# Patient Record
Sex: Female | Born: 1960 | Race: White | Hispanic: No | Marital: Married | Smoking: Never smoker
Health system: Southern US, Academic
[De-identification: ages and names within clinical notes are randomized; demographics above are authoritative.]

## PROBLEM LIST (undated history)

## (undated) DIAGNOSIS — E789 Disorder of lipoprotein metabolism, unspecified: Secondary | ICD-10-CM

## (undated) DIAGNOSIS — E7800 Pure hypercholesterolemia, unspecified: Secondary | ICD-10-CM

## (undated) DIAGNOSIS — L719 Rosacea, unspecified: Secondary | ICD-10-CM

## (undated) DIAGNOSIS — E785 Hyperlipidemia, unspecified: Secondary | ICD-10-CM

## (undated) HISTORY — PX: HX MENISCECTOMY: SHX123

## (undated) HISTORY — PX: HX OOPHORECTOMY: SHX86

## (undated) HISTORY — PX: HX ENDOMETRIAL ABLATION: 2100001129

## (undated) HISTORY — DX: Rosacea, unspecified: L71.9

## (undated) HISTORY — PX: HX ADENOIDECTOMY: SHX29

## (undated) HISTORY — PX: HX WISDOM TEETH EXTRACTION: SHX21

## (undated) HISTORY — PX: HX TONSIL AND ADENOIDECTOMY: SHX28

## (undated) HISTORY — DX: Pure hypercholesterolemia, unspecified: E78.00

## (undated) NOTE — Progress Notes (Signed)
 Formatting of this note is different from the original.  Subjective   Patient ID: Erica Harper is a 28 y.o. female presenting to the Urgent Care with a chief complaint of Hand Injury (Was running tripped over rug and landed on left hand /Hand painful to move and swollen happened yesterday ).    History provided by:  Patient  Hand Injury  Location:  Wrist  Wrist location:  L wrist  Pain details:     Quality:  Dull    Radiates to:  Does not radiate    Severity:  Mild    Duration:  1 day    Timing:  Constant    Progression:  Unchanged  Dislocation: no    Foreign body present:  No foreign bodies  Relieved by:  Rest  Worsened by:  Movement    Objective   BP 138/82 (BP Location: Right arm, Patient Position: Sitting, BP Cuff Size: Large adult)   Pulse 79   Temp 36.6 C (97.9 F) (Oral)   Resp 16   Ht 1.6 m (5' 3)   Wt 95.3 kg (210 lb)   SpO2 98%   BMI 37.20 kg/m     Physical Exam  Constitutional:       Appearance: Normal appearance.   HENT:      Head: Normocephalic and atraumatic.   Eyes:      Extraocular Movements: Extraocular movements intact.      Pupils: Pupils are equal, round, and reactive to light.   Cardiovascular:      Rate and Rhythm: Normal rate.   Pulmonary:      Effort: Pulmonary effort is normal.   Musculoskeletal:         General: Swelling and tenderness present. Normal range of motion.      Cervical back: Normal range of motion.      Comments: Mild tenderness and swelling of the dorsal and radial aspect of the left wrist. Full range of motion but has some pain with flexion.    Skin:     General: Skin is warm and dry.      Findings: No bruising or rash.   Neurological:      General: No focal deficit present.      Mental Status: She is alert.   Psychiatric:         Mood and Affect: Mood normal.         Assessment & Plan    Assessment & Plan  Sprain of left wrist, initial encounter    Orders:    XR wrist 3+ views left        In-House Lab Results:   No results found for this or any previous visit.      In-House Imaging Reads:  X-Ray Wet Read Documentation  XR wrist 3+ views left        3 views of the wrist. On my review of images, there are no acute bony abnormalities. Small amount of soft issue swelling along radial aspect of wrist. Radiologist read pending.             Procedure Documentation:  Procedures     ED Course & MDM     Electronically signed by Clem KANDICE Cobia, DO at 08/25/2023  5:41 PM EDT

---

## 2006-04-21 ENCOUNTER — Ambulatory Visit (HOSPITAL_COMMUNITY): Payer: Self-pay | Admitting: OBSTETRICS/GYNECOLOGY

## 2007-04-30 ENCOUNTER — Other Ambulatory Visit: Payer: Self-pay

## 2009-06-11 ENCOUNTER — Encounter (FREE_STANDING_LABORATORY_FACILITY)
Admit: 2009-06-11 | Discharge: 2009-06-11 | Disposition: A | Discharge status: Home / Self Care | Payer: BC Managed Care – PPO | Attending: Obstetrics & Gynecology | Admitting: Obstetrics & Gynecology

## 2009-06-11 ENCOUNTER — Ambulatory Visit (INDEPENDENT_AMBULATORY_CARE_PROVIDER_SITE_OTHER): Payer: BC Managed Care – PPO | Admitting: Obstetrics & Gynecology

## 2009-06-11 ENCOUNTER — Encounter (INDEPENDENT_AMBULATORY_CARE_PROVIDER_SITE_OTHER): Payer: Self-pay | Admitting: Obstetrics & Gynecology

## 2009-06-11 DIAGNOSIS — Z01419 Encounter for gynecological examination (general) (routine) without abnormal findings: Secondary | ICD-10-CM | POA: Insufficient documentation

## 2009-06-11 NOTE — Progress Notes (Signed)
Subjective:     Patient ID:  Erica Harper is an 49 y.o. female   Chief Complaint:  Chief Complaint   Patient presents with   . Gyn Exam         HPI 49 yo G2P2 spouse s/p vasectomy for annual exam. Monthly menses, + cramps, changes protection q 90-120 mins for 1st day. Neg pain.  All paps nl.  Chol ?  Dt 10 yrs ago    Review of Systems   Gastrointestinal:        Itchy hemorrhoids   Genitourinary:        Occ sui with sneeze or URI   All other systems reviewed and are negative.      Objective:   Physical Exam   Constitutional: She is oriented. She appears well-developed and well-nourished. No distress.   HENT:   Head: Normocephalic.   Eyes: Right eye exhibits no discharge. Left eye exhibits no discharge. No scleral icterus.   Neck: Normal range of motion.   Pulm:  Effort normal.  Observations:  no respiratory distress.    Abdomen:   She exhibits no distension. Soft. No tenderness. She has no rebound and no guarding.   GU:    External Exam:    External exam normal.    No neither urethral exudate nor urethral tenderness.  No genital lesions present.      Vaginal Exam:    Vagina normal to exam.  No cystocele present.  No rectocele present.  No vaginal prolapse.  No vaginal discharge present.  No vaginal odor present.  No amine odor present.  Cervix is normal to exam.      Bladder:    Bladder is normal to exam.      Uterus:   Uterus is normal to exam.     Position:  Anteverted.    Contour:  Regular.    Mobility:  Mobile.      There is no no uterine prolapse noted.      Adenexal findings:    Mobile, no masses and no tenderness.        Rectal Exam:   Rectum normal to exam.     Ortho/Musculoskeletal:   Normal range of motion.   Neurological: She is alert and oriented.   Skin: Skin is warm and dry. No rash noted. She is not diaphoretic. No erythema. No pallor.   Psychiatric: She has a normal mood and affect. Her behavior is normal. Judgment and thought content normal.      .Breasts:  Symmetrical, no nipple retraction, skin dimpling, no dominant masses, nipple discharge or lymphadenopathy bilaterally     Assessment & Plan:     49 yo G2P2 spouse s/p vasectomy with menorrhagia.  Sister is amenorrheic after ablation.  Will consider it.  Will get pelvic u/s.  Will need endodmetrial biopsy if she opts for ablation.  Annual mammogram.  Calcium with D 500-600 mg bid. Monthly SBE.

## 2009-06-13 ENCOUNTER — Ambulatory Visit (INDEPENDENT_AMBULATORY_CARE_PROVIDER_SITE_OTHER): Payer: BC Managed Care – PPO

## 2009-06-13 LAB — HISTORICAL CYTOPATHOLOGY-GYN (PAP AND HPV TESTS)

## 2009-06-13 NOTE — Progress Notes (Signed)
See [place] report  Zandrea Kenealy, RDMS 06/13/2009, 9:53 AM

## 2009-06-15 ENCOUNTER — Ambulatory Visit (INDEPENDENT_AMBULATORY_CARE_PROVIDER_SITE_OTHER): Payer: Self-pay | Admitting: Obstetrics & Gynecology

## 2009-06-15 NOTE — Telephone Encounter (Signed)
VO Dr Kathyrn Sheriff  Pt needs to make appt to come in and discuss.  Hilaria Ota, LPN 0/98/1191, 47:82 AM

## 2009-06-15 NOTE — Telephone Encounter (Signed)
Message copied by Ricci Barker, S on Fri Jun 15, 2009 11:45 AM  ------       Message from: [place]       Created: Fri Jun 15, 2009 10:57 AM         >> Erica Harper 06/15/2009 10:57 AM       This is a pt of Dr Kathyrn Sheriff- pt was seen on 4-27 for an U/S and a fibroid was found.  She wanted to know what the next step in treatment is going to be.  She asked that someone call her to discuss this.  Her home # is [phone] and cell is [phone].  Thanks

## 2009-06-18 ENCOUNTER — Ambulatory Visit (INDEPENDENT_AMBULATORY_CARE_PROVIDER_SITE_OTHER): Payer: BC Managed Care – PPO | Admitting: Obstetrics & Gynecology

## 2009-06-18 NOTE — H&P (Addendum)
I have reviewed the images.   Lg post fibroid, poss pedunculated about 6 cm,and several smaller intramural fibroids.   endom 1.46 cm. Ovaries not well visualized.   Will evaluate ovaries via MRI pelvis.

## 2009-06-18 NOTE — Progress Notes (Signed)
Her to f/u u/s results.  Lg post fibroid, poss pedunculated about 6 cm,and several smaller intramural fibroids.  endom 1.46 cm.  Ovaries not well visualized.  Will evaluate ovaries via MRI pelvis.  Pt considering [place] or Novasure.   Plan:  F/u after MRI.  Will do embx at that visit.  Reviewed u/s images at computer with pt.  15/15 mins reviewing images, disc results and options.

## 2009-06-21 ENCOUNTER — Ambulatory Visit (INDEPENDENT_AMBULATORY_CARE_PROVIDER_SITE_OTHER): Payer: Self-pay | Admitting: Obstetrics & Gynecology

## 2009-06-21 NOTE — Telephone Encounter (Signed)
Gave message to anita to schd.appt.  Hilaria Ota, LPN 02/22/1094, 0:45 PM

## 2009-06-21 NOTE — Telephone Encounter (Signed)
Message copied by Hilaria Ota on Thu Jun 21, 2009  2:05 PM  ------       Message from: [place], [place]       Created: Thu Jun 21, 2009 12:21 PM         >> [name] 06/21/2009 12:21 PM       [place] pt              Pt is calling Dr Kathyrn Sheriff told her to schedule a follow up the day after her mri. She is scheduled for 06/26/2009 for the MRI.  I don't have any appts with [facility] until 07/17/2009. Please advise and call pt.              Thank you.

## 2009-06-26 ENCOUNTER — Ambulatory Visit (INDEPENDENT_AMBULATORY_CARE_PROVIDER_SITE_OTHER): Payer: Self-pay | Admitting: Obstetrics & Gynecology

## 2009-06-26 NOTE — Telephone Encounter (Signed)
Message copied by Hilaria Ota on Tue Jun 26, 2009 12:39 PM  ------       Message from: [place]       Created: Tue Jun 26, 2009 12:27 PM         >> [name] 06/26/2009 12:27 PM       Erica Harper              Harper is calling because she is scheduled for an MRI today and started her period. He wanted to make sure it was ok to still get the MRI.               She is also scheduled for a biopsy tomorrow with Dr Kathyrn Sheriff and wanted to make sure it was ok to be on her period with the biopsy.              Please return her call.              Thank you.

## 2009-06-26 NOTE — Telephone Encounter (Signed)
Pt called started period wanted to know if she could have MRI done explained yes but needs to cancel biopsy tomoorrow related to period per Dr Kathyrn Sheriff.  Hilaria Ota, LPN 1/91/4782, 95:62 PM

## 2009-06-27 ENCOUNTER — Ambulatory Visit (INDEPENDENT_AMBULATORY_CARE_PROVIDER_SITE_OTHER): Payer: BC Managed Care – PPO | Admitting: Obstetrics & Gynecology

## 2009-06-28 ENCOUNTER — Ambulatory Visit (INDEPENDENT_AMBULATORY_CARE_PROVIDER_SITE_OTHER): Payer: Self-pay | Admitting: Obstetrics & Gynecology

## 2009-06-28 NOTE — Telephone Encounter (Signed)
Pt called wanting results of MRI.  Informed her that MRI showed the fibroids (which she was aware of) and tiny ovarian cyst

## 2009-06-28 NOTE — Telephone Encounter (Signed)
Patient not at home left a message that Dr. Isidore Moos was returning her phone call.

## 2009-06-28 NOTE — Telephone Encounter (Signed)
Message copied by Valerie Salts on Thu Jun 28, 2009  8:50 AM  ------       Message from: [employer]       Created: Wed Jun 27, 2009  4:06 PM         >> Mikey [employer] 06/27/2009 04:06 PM       [facility] patient              Patient is calling for test results.       Please call [phone]

## 2009-07-10 ENCOUNTER — Ambulatory Visit (INDEPENDENT_AMBULATORY_CARE_PROVIDER_SITE_OTHER): Payer: BC Managed Care – PPO | Admitting: Obstetrics & Gynecology

## 2009-07-10 ENCOUNTER — Encounter (FREE_STANDING_LABORATORY_FACILITY)
Admit: 2009-07-10 | Discharge: 2009-07-10 | Disposition: A | Discharge status: Home / Self Care | Payer: BLUE CROSS/BLUE SHIELD | Attending: Obstetrics & Gynecology | Admitting: Obstetrics & Gynecology

## 2009-07-10 VITALS — BP 140/90 | Ht 65.0 in | Wt 218.0 lb

## 2009-07-10 DIAGNOSIS — N920 Excessive and frequent menstruation with regular cycle: Secondary | ICD-10-CM | POA: Diagnosis present

## 2009-07-10 NOTE — Procedures (Signed)
Consent obtained.  Cvx stabilized and prepped with Betadine.Pipelle inserted without difficulty.  Uterus sounds to 9 CM. Adequate tissue sample obtained.  Pt tolerated well

## 2009-07-10 NOTE — Progress Notes (Signed)
49 yo G2P2 spouse s/p vasectomy, h/o menorrhagia and fibroids here for [facility].    MRI results: Nabothian cysts are noted. The uterus appears prominent situated in the midline. There is what appears to be a subendometrial fibroid in the uterine fundus posteriorly measuring 2.0 to 2.9 cm. A second larger fibroid is noted in the posterior uterine fundus measuring on the order of 2.6 to 4.8 cm and a third lobulated fibroid measuring on the order of 3.2 to 4.2 cm more cephalad in the uterine fundus. No adnexal masses are seen. The adnexal regions show the ovaries to have tiny cysts. No masses are noted no free fluid is seen.   O: BP 140/90   Ht 1.651 m (5\' 5" )   Wt 98.884 kg (218 lb)   BMI 36.28 kg/m2   LMP 05/25/2009  embx done  A/P:  Options of Novasure, Mirena, and hysterectomy discussed.  Since her bleeding is heavy only 1 day per month, she prefers a less invasive approach.  We will check benefits for Mirena.  She will call next week for bx results and to let me know her final decision.   10 mins spent discussing tx options for menorrhagia and MRI results

## 2009-07-12 LAB — HISTORICAL SURGICAL PATHOLOGY SPECIMEN

## 2009-07-19 ENCOUNTER — Ambulatory Visit (INDEPENDENT_AMBULATORY_CARE_PROVIDER_SITE_OTHER): Payer: Self-pay

## 2009-07-19 NOTE — Telephone Encounter (Signed)
Pt notified that pap and biopsy are normal left message on house phone and cell phone.  Hilaria Ota, LPN 06/22/2128, 8:65 PM

## 2009-07-19 NOTE — Telephone Encounter (Signed)
Message copied by Hilaria Ota on Thu Jul 19, 2009  1:54 PM  ------       Message from: Barkley Bruns       Created: Wed Jul 18, 2009  2:55 PM         >> [facility]ICK 07/18/2009 02:55 PM       [place]. Patient was calling for results of biopsy done 5/24.

## 2009-07-23 ENCOUNTER — Ambulatory Visit (INDEPENDENT_AMBULATORY_CARE_PROVIDER_SITE_OTHER): Payer: Self-pay | Admitting: Obstetrics & Gynecology

## 2009-07-23 NOTE — Telephone Encounter (Signed)
Message copied by Valerie Salts on Mon Jul 23, 2009  9:53 AM  ------       Message from: Georgina Peer       Created: Thu Jul 19, 2009  3:56 PM         >> Georgina Peer 07/19/2009 03:56 PM       Dr. Kathyrn Sheriff patient.  Would like to find out what the insurance would pay on an Ablasion and how soon she could get it done.  Please call to advise.  Thank you

## 2009-07-23 NOTE — Telephone Encounter (Signed)
Attempted to call patient back, left a message that Dr. Kathyrn Sheriff office was returning her call.

## 2009-07-26 NOTE — Telephone Encounter (Signed)
Message copied by Valerie Salts on Thu Jul 26, 2009  9:20 AM  ------       Message from: Valerie Salts       Created: Mon Jul 23, 2009  9:54 AM       Regarding: waiting for return call         >> Georgina Peer 07/19/2009 03:56 PM       Dr. Kathyrn Sheriff patient.  Would like to find out what the insurance would pay on an Ablasion and how soon she could get it done.  Please call to advise.  Thank you

## 2009-07-26 NOTE — Telephone Encounter (Signed)
Patient did not return call.

## 2009-08-01 ENCOUNTER — Ambulatory Visit (INDEPENDENT_AMBULATORY_CARE_PROVIDER_SITE_OTHER): Payer: Self-pay | Admitting: Obstetrics & Gynecology

## 2009-08-01 ENCOUNTER — Other Ambulatory Visit (INDEPENDENT_AMBULATORY_CARE_PROVIDER_SITE_OTHER): Payer: BC Managed Care – PPO

## 2009-08-01 ENCOUNTER — Ambulatory Visit (INDEPENDENT_AMBULATORY_CARE_PROVIDER_SITE_OTHER): Payer: BC Managed Care – PPO | Admitting: Obstetrics & Gynecology

## 2009-08-01 NOTE — Telephone Encounter (Signed)
Message copied by Hilaria Ota on Wed Aug 01, 2009 11:15 AM  ------       Message from: Holly Bodily       Created: Wed Aug 01, 2009  9:37 AM         >> Holly Bodily 08/01/2009 09:37 AM       Dr. Kathyrn Sheriff pt         The patient asked if Dr. Kathyrn Sheriff would call her concerning some questions she has concerning a procedure.  Please advise the pt at [phone].   Thank you

## 2009-08-10 ENCOUNTER — Other Ambulatory Visit (INDEPENDENT_AMBULATORY_CARE_PROVIDER_SITE_OTHER): Payer: BC Managed Care – PPO

## 2009-08-10 ENCOUNTER — Ambulatory Visit (INDEPENDENT_AMBULATORY_CARE_PROVIDER_SITE_OTHER): Payer: BC Managed Care – PPO | Admitting: Obstetrics & Gynecology

## 2009-08-10 ENCOUNTER — Ambulatory Visit (INDEPENDENT_AMBULATORY_CARE_PROVIDER_SITE_OTHER): Payer: BC Managed Care – PPO

## 2009-08-24 NOTE — Procedures (Signed)
Under u/s guidance, sterile saline was infused into the uterine cavity under sterile conditions using a standard HSG catheter.  The uterine cavity was noted to be free of deformities from the fibroid.  Catheter was removed.  Pt tolerated well.

## 2009-08-28 ENCOUNTER — Ambulatory Visit (INDEPENDENT_AMBULATORY_CARE_PROVIDER_SITE_OTHER): Payer: BC Managed Care – PPO | Admitting: Obstetrics & Gynecology

## 2009-08-28 VITALS — BP 110/78 | Wt 203.0 lb

## 2009-08-28 NOTE — Progress Notes (Signed)
49 yo G2P2 spouse s/p vasectomy with h/o menorrhagia, fibroids, severe dysmenorrhea for D&C, H/S, Novasure.  embx proliferative. MRI of pelvis :There is what appears to be a subendometrial fibroid in the uterine fundus posteriorly measuring 2.0 to 2.9 cm. A second larger fibroid is noted in the posterior uterine fundus measuring on the order of 2.6 to 4.8 cm and a third lobulated fibroid measuring on the order of 3.2 to 4.2 cm more cephalad in the uterine fundus. sonohyst revealed a nl endometrial cavity.  No past medical history on file.  Past Surgical History   Procedure Date   . Hx cesarean section      x2   . Hx adenoidectomy        Family History   Problem Relation Age of Onset   . Diabetes Maternal Grandfather    . Breast Cancer Maternal Grandmother 96   . Colon Cancer Neg Hx    . Clotting Disorder Neg Hx    . Heart Disease Neg Hx    . Hypertension Neg Hx    . Osteoporosis Neg Hx    . Rectal Cancer Neg Hx    . Uterine Cancer Neg Hx    . Uterine Fibroids Neg Hx        No current outpatient prescriptions on file.       Allergies   Allergen Reactions   . Penicillins Hives/ Urticaria       History   Social History   . Marital Status: Married     Spouse Name: N/A     Number of Children: N/A   . Years of Education: N/A   Occupational History   .  [employer]     kindergarten aide [place]   .       sp:  [employer]   Social History Main Topics   . Smoking status: Never Smoker    . Smokeless tobacco: Not on file   . Alcohol Use: No   . Drug Use: Not on file   . Sexually Active: Yes -- Female partner(s)     Birth Control/ Protection: Vasectomy   Other Topics Concern   . Abuse/domestic Violence No   . Breast Self Exam Yes   . Calcium Intake Adequate Yes   . Seat Belt Yes   Social History Narrative   . No narrative on file       ROS:  Denies chest pain, dyspnea.  Reports nl bowel and bladder fx.  BP 110/78   Wt 92.08 kg (203 lb)  HEENT unremarkable  Lungs CTAB  Heart RRR w/o RMG   abd soft, NT, no organomegaly, NABS  extr NT, w/o edema  EG: w/o lesions  cvx parous w/o lesions  Corpus upper limits of nl size w/o palpable adnexal masses   A:  49 yo G2P2 with symptomatic fibroids.  P:  D&C, dx H/S, endometrial ablation.  Risks, benefits, and alternatives to surgery discussed with patient and she agrees to proceed.

## 2009-08-29 ENCOUNTER — Other Ambulatory Visit (INDEPENDENT_AMBULATORY_CARE_PROVIDER_SITE_OTHER): Payer: BC Managed Care – PPO

## 2009-08-29 ENCOUNTER — Ambulatory Visit (INDEPENDENT_AMBULATORY_CARE_PROVIDER_SITE_OTHER): Payer: BC Managed Care – PPO | Admitting: Obstetrics & Gynecology

## 2009-08-30 ENCOUNTER — Other Ambulatory Visit: Payer: Self-pay | Admitting: Obstetrics & Gynecology

## 2009-08-31 ENCOUNTER — Ambulatory Visit (INDEPENDENT_AMBULATORY_CARE_PROVIDER_SITE_OTHER): Payer: Self-pay | Admitting: Obstetrics & Gynecology

## 2009-08-31 NOTE — Telephone Encounter (Signed)
Message copied by Ricci Barker, S on Fri Aug 31, 2009 11:29 AM  ------       Message from: [place], [place]       Created: Fri Aug 31, 2009  9:41 AM         >> [name] 08/31/2009 09:41 AM       [facility] pt              Pt had surgery yesterday with [place] and she was told to call and schedule a follow up appt.  She didn't know when she needed to schedule it.  Please advise.               She is also having a problem.  She said when she uses the bathroom it burns when she pees.  She isn't sure if it is from when she had a catheter or not.  Please call.                             Thank you.

## 2009-08-31 NOTE — Telephone Encounter (Signed)
Pt was having burning after urination had ablation yesterday states much better today no burning she thinks it is where they cathed her,instructed pt to drink plenty of water and cranberry juice, call if any problems.  Marland Kitchens

## 2009-09-14 ENCOUNTER — Ambulatory Visit (INDEPENDENT_AMBULATORY_CARE_PROVIDER_SITE_OTHER): Payer: BC Managed Care – PPO | Admitting: Obstetrics & Gynecology

## 2009-09-14 VITALS — BP 125/83 | HR 79 | Ht 65.0 in | Wt 200.0 lb

## 2009-09-14 NOTE — Progress Notes (Signed)
49 yo G2P2 s/p Novasure, here for post op ck.  Doing well postoperatively, no c/o.  Not due for next menses for 2 more wks.  Denies fever.  Reports nl bowel and bladder fx.  O:  BP 125/83   Pulse 79   Ht 1.651 m (5\' 5" )   Wt 90.719 kg (200 lb)   BMI 33.28 kg/m2   LMP 08/25/2009  A:  Nl post op check.  P:  F/u for a/e 4/12

## 2010-08-13 ENCOUNTER — Ambulatory Visit (INDEPENDENT_AMBULATORY_CARE_PROVIDER_SITE_OTHER): Payer: Self-pay | Admitting: Obstetrics & Gynecology

## 2010-08-13 NOTE — Telephone Encounter (Signed)
Message copied by Hilaria Ota on Tue Aug 13, 2010  3:30 PM  ------       Message from: Barkley Bruns       Created: Tue Aug 13, 2010  3:01 PM         >> [facility] MUSICK 08/13/2010 03:01 PM       [place]. Patient is scheduled for her annual in October. She is past-due for her mammogram and would like to have order mailed to her so she can get this scheduled.

## 2010-10-01 ENCOUNTER — Ambulatory Visit (HOSPITAL_COMMUNITY): Payer: BC Managed Care – PPO

## 2010-11-26 ENCOUNTER — Ambulatory Visit (INDEPENDENT_AMBULATORY_CARE_PROVIDER_SITE_OTHER): Payer: BC Managed Care – PPO | Admitting: Obstetrics & Gynecology

## 2010-12-17 ENCOUNTER — Ambulatory Visit (INDEPENDENT_AMBULATORY_CARE_PROVIDER_SITE_OTHER): Payer: BC Managed Care – PPO | Admitting: Obstetrics & Gynecology

## 2010-12-17 ENCOUNTER — Encounter (INDEPENDENT_AMBULATORY_CARE_PROVIDER_SITE_OTHER): Payer: Self-pay | Admitting: Obstetrics & Gynecology

## 2010-12-17 VITALS — BP 110/78 | Ht 65.0 in | Wt 192.2 lb

## 2010-12-17 NOTE — Progress Notes (Signed)
Subjective:     Patient ID:  Erica Harper is an 50 y.o. female   Chief Complaint:    Chief Complaint   Patient presents with   . Gyn Exam     annual exam       HPI50  Yo G2P2 s/p TL s/p ablation 2011.   Menses are lighter, shorter, less cramping and still occurring monthly.  Happy with ablation results. neg pain  Pap 4/11 neg with neg HRHPV  Mammogram nl this summer  Tchol 211, HDL 82  Will turn 51 in May 2013.  I have reviewed and updated as appropriate the past medical, family and social history.      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  Plan:  F/u 1 yr for annual exam.  Will need pap 05/2012.  Annual mammogram.  Continue weight loss.  Offered referral to vascular.  Declines for now.

## 2011-04-30 ENCOUNTER — Ambulatory Visit (INDEPENDENT_AMBULATORY_CARE_PROVIDER_SITE_OTHER): Payer: BC Managed Care – PPO | Admitting: Obstetrics & Gynecology

## 2012-10-20 ENCOUNTER — Encounter (INDEPENDENT_AMBULATORY_CARE_PROVIDER_SITE_OTHER): Payer: Self-pay | Admitting: Advanced Practice Midwife

## 2012-10-20 ENCOUNTER — Encounter (FREE_STANDING_LABORATORY_FACILITY)
Admit: 2012-10-20 | Discharge: 2012-10-20 | Disposition: A | Discharge status: Home / Self Care | Payer: BC Managed Care – PPO | Attending: Advanced Practice Midwife | Admitting: Advanced Practice Midwife

## 2012-10-20 ENCOUNTER — Ambulatory Visit (INDEPENDENT_AMBULATORY_CARE_PROVIDER_SITE_OTHER): Payer: BC Managed Care – PPO | Admitting: Advanced Practice Midwife

## 2012-10-20 ENCOUNTER — Other Ambulatory Visit (FREE_STANDING_LABORATORY_FACILITY): Payer: Self-pay | Admitting: Advanced Practice Midwife

## 2012-10-20 VITALS — BP 136/80 | HR 92 | Ht 65.0 in | Wt 205.0 lb

## 2012-10-20 DIAGNOSIS — Z124 Encounter for screening for malignant neoplasm of cervix: Secondary | ICD-10-CM | POA: Insufficient documentation

## 2012-10-20 DIAGNOSIS — Z1151 Encounter for screening for human papillomavirus (HPV): Secondary | ICD-10-CM | POA: Insufficient documentation

## 2012-10-20 DIAGNOSIS — Z01419 Encounter for gynecological examination (general) (routine) without abnormal findings: Secondary | ICD-10-CM | POA: Insufficient documentation

## 2012-10-20 NOTE — H&P (Addendum)
Erica Harper is a 52 y.o.  female  here for routine exam.  Hx of EMB, D&C, and ablation in 2011.  States never stopped bleeding, was lighter for while.  Has since increased slightly, but still manageable (1 day heavy, lasts 4 days, regular every 28 days).  Info given on Mirena, hormone therapy or hysterectomy.  Not interested in any at this time.      Current Complaints: Per above.    Sexually active: yes     With husband        Mutually Monogamous: yes      Problems? denies  Exercise: encouraged weight bearing exercise      Marital status: Married  Children: Two - daughter graduated from [employer] OT program and working in [place] as an OT now.  I saw her for her annual this past spring.     Gynecologic History  Contraception: vasectomy  Last Pap: 05/2009  Results: Negative/HPV negative  Last Mammogram: 10/02/2010  Results: normal    Obstetric History  Gravida: 2  Para: 2  AB: 0  LC: 2    Past Medical History   Diagnosis Date   . Fibroids        Family History   Problem Relation Age of Onset   . Diabetes Maternal Grandfather    . Breast Cancer Maternal Grandmother 96   . Heart Disease Mother 72     stents   . Hypertension Mother        No current outpatient prescriptions on file.     No current facility-administered medications for this visit.       Allergies   Allergen Reactions   . Penicillins Hives/ Urticaria       History     Social History   . Marital Status: Married     Spouse Name: N/A     Number of Children: N/A   . Years of Education: N/A     Occupational History   .  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       sp:  [employer]     Social History Main Topics   . Smoking status: Never Smoker    . Smokeless tobacco: Not on file   . Alcohol Use: No   . Drug Use: Not on file   . Sexually Active: Yes -- Female partner(s)     Birth Control/ Protection: Vasectomy     Other Topics Concern   . Abuse/Domestic Violence No   . Breast Self Exam Yes   . Calcium Intake Adequate Yes   . Seat Belt Yes      Social History Narrative   . No narrative on file       Review of Systems    Constitutional: negative  Eyes: positive for contacts/glasses  Ears, nose, mouth, throat, and face: negative  Respiratory: negative  Cardiovascular: negative  Gastrointestinal: negative  Genitourinary:negative  Integument/breast: negative  Hematologic/lymphatic: negative  Musculoskeletal:negative  Neurological: positive for migraines every few months - typically stress related  Behavioral/Psych: negative  Endocrine: negative  Allergic/Immunologic: positive for PCN allergy      Objective:   Patient's last menstrual period was 10/11/2012.      General:     feels well, no complaints, exam limited by habitus   HEENT:  Normal   Thyroid:  normal to inspection and palpation   Lymph Nodes:  Cervical, supraclavicular, and axillary nodes normal.  Breasts:  normal without suspicious masses, skin or nipple changes or axillary nodes, self-exam is taught and encouraged   Lungs:  clear to auscultation bilaterally   Heart:  regular rate and rhythm   Abdomen:  soft, non-tender. No masses,  no organomegaly   Extremities:  extremities normal, atraumatic, no cyanosis or edema   Neurologic:  oriented, normal mood, gait normal; reflexes normal and symmetric   Vulva:  normal female genitalia, no lesions   GU:  normal urethral meatus   Vagina:  normal mucosa, no lesions or discharge noted   Cervix:  no lesions, multiparous appearance, no cervical motion tenderness, no bleeding following Pap   Mucous:  No Discharge    Uterus:  mobile, non tender, mid line, slightly enlarged - known uterine fibroids   Left Adnexa:  Not palpable, No masses, nodularity, tenderness   Right Adnexa:  Not palpable, No masses, nodularity, tenderness   Rectovaginal:  Deferred       Assessment:     Essentially normal Gyne exam for 52 y.o.  y.o female.    Plan:      1. Education Reviewed and Recommended: Self Breast Exams, Calcium Supplements, Weight Bearing Exercise, discussed various options if desired - including hysterectomy.  If considering, advised to call so I can make sono appt and f/u GYN appt with Dr. Kathyrn Sheriff.  2. Contraception: vasectomy  3. Hormone Replacement Therapy: n/a  4. Pap was done  Follow up: 1 year or prn.  Seen independently with co-signing faculty present in the clinic.     Villa Herb, CNM 10/20/2012, 10:09 AM

## 2012-10-25 LAB — HISTORICAL CYTOPATHOLOGY-GYN (PAP AND HPV TESTS)

## 2012-11-08 ENCOUNTER — Encounter (INDEPENDENT_AMBULATORY_CARE_PROVIDER_SITE_OTHER): Payer: Self-pay | Admitting: Advanced Practice Midwife

## 2012-12-29 ENCOUNTER — Ambulatory Visit (INDEPENDENT_AMBULATORY_CARE_PROVIDER_SITE_OTHER): Payer: BC Managed Care – PPO | Admitting: Obstetrics & Gynecology

## 2013-06-22 ENCOUNTER — Encounter (INDEPENDENT_AMBULATORY_CARE_PROVIDER_SITE_OTHER): Payer: Self-pay | Admitting: Advanced Practice Midwife

## 2014-03-01 ENCOUNTER — Ambulatory Visit (INDEPENDENT_AMBULATORY_CARE_PROVIDER_SITE_OTHER): Payer: Self-pay | Admitting: Advanced Practice Midwife

## 2014-03-01 NOTE — Telephone Encounter (Signed)
-----   Message from Hassie Bruce sent at 02/28/2014  3:18 PM EST -----  >> Hassie Bruce 02/28/2014 03:18 PM  Message is for Debby Freiberg.  Pt has a pending appt with you on 2.12.16.  Would like an order faxed to [facility] so she can get her mammo done.  Has ask that someone call to advise her once it has been done so she can get it scheduled.  thanks

## 2014-03-01 NOTE — Telephone Encounter (Signed)
Mammogram order mailed pt notified.  Latanya Presser, LPN  1/58/7276, 18:48

## 2014-03-31 ENCOUNTER — Ambulatory Visit (INDEPENDENT_AMBULATORY_CARE_PROVIDER_SITE_OTHER): Payer: BC Managed Care – PPO | Admitting: Advanced Practice Midwife

## 2014-03-31 ENCOUNTER — Encounter (INDEPENDENT_AMBULATORY_CARE_PROVIDER_SITE_OTHER): Payer: Self-pay | Admitting: Advanced Practice Midwife

## 2014-03-31 VITALS — BP 120/80 | Ht 65.0 in | Wt 205.0 lb

## 2014-03-31 DIAGNOSIS — D259 Leiomyoma of uterus, unspecified: Secondary | ICD-10-CM

## 2014-03-31 DIAGNOSIS — R102 Pelvic and perineal pain: Secondary | ICD-10-CM

## 2014-03-31 DIAGNOSIS — Z1231 Encounter for screening mammogram for malignant neoplasm of breast: Secondary | ICD-10-CM

## 2014-03-31 DIAGNOSIS — Z01419 Encounter for gynecological examination (general) (routine) without abnormal findings: Principal | ICD-10-CM

## 2014-03-31 NOTE — H&P (Addendum)
NOTE AUTHORED BY Debby Freiberg, MSN, CNM - cosigned by physician per [facility] requirements.    Erica Harper is a 54 y.o.  female  here for routine exam.  Still having monthly menses.  States first day is always very heavy, then lasts approx 5 days.  4 days after end she will have "leakage" - described as spotting.  Dr. Rayburn Felt attempted an ablation, but was unable to perform due to uterine fibroids.  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  Sono ordered.      Current Complaints: per above.    Sexually active: yes     With husband        Mutually Monogamous: yes      Problems? denies  Exercise: encouraged      BP 120/80 mmHg   Ht 1.651 m (5\' 5" )   Wt 92.987 kg (205 lb)   BMI 34.11 kg/m2   LMP 03/16/2014  Body mass index is 34.11 kg/(m^2).    Marital status: Married  Children: Two     Gynecologic History  Contraception: vasectomy  Last Pap: 10/20/12  Results: Normal/HPV negative  Last Mammogram: 2015  Results: normal    Obstetric History  Gravida: 2  Para: 2  AB: 0  LC: 2    Past Medical History   Diagnosis Date    Fibroids            Family History   Problem Relation Age of Onset    Diabetes Maternal Grandfather     Breast Cancer Maternal Grandmother 96    Heart Disease Mother 76     stents    Hypertension Mother            No current outpatient prescriptions on file.     No current facility-administered medications for this visit.       Allergies   Allergen Reactions    Penicillins Hives/ Urticaria       History     Social History    Marital Status: Married     Spouse Name: N/A     Number of Children: N/A    Years of Education: N/A     Occupational History     [employer]     kindergarten aide [employer]          sp:  [employer]     Social History Main Topics    Smoking status: Never Smoker     Smokeless tobacco: Not on file    Alcohol Use: No    Drug Use: Not on file    Sexual Activity:     Partners: Male     Birth Control/ Protection: Vasectomy          Other Topics Concern    Abuse/Domestic Violence No    Breast Self Exam Yes    Calcium Intake Adequate Yes    Seat Belt Yes     Social History Narrative       Review of Systems    Constitutional: negative  Eyes: positive for contacts/glasses  Ears, nose, mouth, throat, and face: negative  Respiratory: negative  Cardiovascular: negative  Gastrointestinal: negative  Genitourinary:negative  Integument/breast: negative  Hematologic/lymphatic: negative  Musculoskeletal:negative  Neurological: positive for headaches  Behavioral/Psych: negative  Endocrine: negative  Allergic/Immunologic: positive for PCN        Objective:   Patient's last menstrual period was 03/16/2014.      General:  feels well, no complaints, exam limited by habitus   HEENT:  Normal   Thyroid:  normal to inspection and palpation   Lymph Nodes:  Cervical, supraclavicular, and axillary nodes normal.   Breasts:  normal without suspicious masses, skin or nipple changes or axillary nodes, nipples normal without inversion, lesions or discharge, no skin dimpling or peau d'orange, self-exam is taught and encouraged   Lungs:  clear to auscultation bilaterally   Heart:  regular rate and rhythm   Abdomen:  soft, non-tender. No masses,  no organomegaly   Extremities:  extremities normal, atraumatic, no cyanosis or edema   Neurologic:  oriented, normal mood, gait normal; reflexes normal and symmetric   Vulva:  normal female genitalia, no lesions   GU:  normal urethral meatus   Vagina:  normal mucosa, no lesions or discharge noted   Cervix:  no lesions, no cervical motion tenderness   Mucous:  No Discharge    Uterus:  enlarged size, mobile, non tender   Left Adnexa:  Palpable, tender on palpation   Right Adnexa:  Not palpable, No masses, nodularity, tenderness   Rectovaginal:  Deferred       Assessment:     Essentially normal Gyne exam for 54 y.o.  y.o female  1. Visit for gynecologic examination  - MAMMO BILATERAL SCREENING; Future  - OBG US TRANSVAGINAL  GYN (TV); Future    2. Encounter for screening mammogram for malignant neoplasm of breast  - MAMMO BILATERAL SCREENING; Future    3. Uterine leiomyoma, unspecified location  - OBG US TRANSVAGINAL GYN (TV); Future    4. Pelvic pain in female  - OBG US TRANSVAGINAL GYN (TV); Future      Plan:     1. Education Reviewed and Recommended: Self Breast Exams, Calcium Supplements, Weight Bearing Exercise  2. Contraception: vasectomy  3. Hormone Replacement Therapy: n/a  4. Pap was not done  Follow up: 1 year or prn.  Seen independently with co-signing faculty present in the clinic.   Etheleen Mayhew, CNM  03/31/2014, 13:57

## 2014-04-04 ENCOUNTER — Other Ambulatory Visit (INDEPENDENT_AMBULATORY_CARE_PROVIDER_SITE_OTHER): Payer: BC Managed Care – PPO

## 2014-04-04 DIAGNOSIS — R102 Pelvic and perineal pain: Secondary | ICD-10-CM

## 2014-04-04 DIAGNOSIS — D259 Leiomyoma of uterus, unspecified: Secondary | ICD-10-CM

## 2014-04-04 DIAGNOSIS — Z01419 Encounter for gynecological examination (general) (routine) without abnormal findings: Secondary | ICD-10-CM

## 2014-04-18 ENCOUNTER — Other Ambulatory Visit (INDEPENDENT_AMBULATORY_CARE_PROVIDER_SITE_OTHER): Payer: Self-pay | Admitting: Advanced Practice Midwife

## 2014-04-18 DIAGNOSIS — R9389 Abnormal findings on diagnostic imaging of other specified body structures: Secondary | ICD-10-CM

## 2014-04-20 ENCOUNTER — Other Ambulatory Visit (INDEPENDENT_AMBULATORY_CARE_PROVIDER_SITE_OTHER): Payer: BC Managed Care – PPO

## 2014-04-20 ENCOUNTER — Telehealth (INDEPENDENT_AMBULATORY_CARE_PROVIDER_SITE_OTHER): Payer: Self-pay | Admitting: Advanced Practice Midwife

## 2014-04-20 DIAGNOSIS — R9389 Abnormal findings on diagnostic imaging of other specified body structures: Secondary | ICD-10-CM

## 2014-04-20 DIAGNOSIS — R938 Abnormal findings on diagnostic imaging of other specified body structures: Secondary | ICD-10-CM

## 2014-04-20 NOTE — Telephone Encounter (Signed)
Spoke with pt after consulting Dr. Freddi Che regarding f/u sonogram.  Pt advised uterine lining was much better.  Advised if she decided to have hysterectomy due to heavy menstrual bleeding to schedule appt with Dr. Freddi Che - otherwise; will see her at her annual exam.  Seen independently with co-signing faculty present in the clinic. Etheleen Mayhew, CNM  04/20/2014, 15:57

## 2014-04-25 ENCOUNTER — Other Ambulatory Visit (INDEPENDENT_AMBULATORY_CARE_PROVIDER_SITE_OTHER): Payer: BC Managed Care – PPO

## 2015-02-18 HISTORY — PX: HX HYSTERECTOMY: SHX81

## 2015-04-26 ENCOUNTER — Other Ambulatory Visit (FREE_STANDING_LABORATORY_FACILITY): Payer: Self-pay | Admitting: Obstetrics & Gynecology

## 2015-04-26 ENCOUNTER — Encounter (FREE_STANDING_LABORATORY_FACILITY)
Admit: 2015-04-26 | Discharge: 2015-04-26 | Disposition: A | Discharge status: Home / Self Care | Payer: BC Managed Care – PPO | Attending: Obstetrics & Gynecology | Admitting: Obstetrics & Gynecology

## 2015-04-26 ENCOUNTER — Ambulatory Visit (INDEPENDENT_AMBULATORY_CARE_PROVIDER_SITE_OTHER): Payer: BC Managed Care – PPO | Admitting: Obstetrics & Gynecology

## 2015-04-26 VITALS — BP 159/89 | Ht 64.0 in | Wt 206.0 lb

## 2015-04-26 DIAGNOSIS — Z01419 Encounter for gynecological examination (general) (routine) without abnormal findings: Secondary | ICD-10-CM

## 2015-04-26 DIAGNOSIS — N923 Ovulation bleeding: Secondary | ICD-10-CM

## 2015-04-26 DIAGNOSIS — Z1151 Encounter for screening for human papillomavirus (HPV): Secondary | ICD-10-CM | POA: Insufficient documentation

## 2015-04-26 DIAGNOSIS — Z01411 Encounter for gynecological examination (general) (routine) with abnormal findings: Secondary | ICD-10-CM

## 2015-04-26 DIAGNOSIS — Z124 Encounter for screening for malignant neoplasm of cervix: Secondary | ICD-10-CM

## 2015-04-26 DIAGNOSIS — R87619 Unspecified abnormal cytological findings in specimens from cervix uteri: Secondary | ICD-10-CM

## 2015-04-26 DIAGNOSIS — N920 Excessive and frequent menstruation with regular cycle: Secondary | ICD-10-CM

## 2015-04-26 DIAGNOSIS — Z1231 Encounter for screening mammogram for malignant neoplasm of breast: Secondary | ICD-10-CM

## 2015-04-26 DIAGNOSIS — Z0001 Encounter for general adult medical examination with abnormal findings: Secondary | ICD-10-CM

## 2015-04-26 DIAGNOSIS — D259 Leiomyoma of uterus, unspecified: Secondary | ICD-10-CM

## 2015-04-26 NOTE — Addendum Note (Signed)
Addended by: Avagail Whittlesey on: 04/26/2015 04:13 PM     Modules accepted: Orders

## 2015-04-26 NOTE — Addendum Note (Signed)
Addended by: Liz Beach on: 04/26/2015 04:01 PM     Modules accepted: Orders

## 2015-04-26 NOTE — Progress Notes (Signed)
CC:  Annual Exam    HPI: Erica Harper is a 55 y.o. female here for annual exam.  Complaints/Concerns: has had spotting for the past 4 weeks, mostly with wiping but has increased to requiring pads.  She had an ultrasound done last year which showed a fibroid.  She has a history of attempted endometrial ablation but it was unable to be completed.   Menses: monthly with exception of skipping last month; 1st day is heavy, typically lasts for 4 days  Contraception: husband has had vasectomy  She is a kindergarten teacher's aid    OB: c-section x 2  Gyn: most recent pap 10/2012 neg/neg    Past Medical History:   Diagnosis Date    Fibroids          Past Surgical History:   Procedure Laterality Date    HX ADENOIDECTOMY      HX CESAREAN SECTION      x2    HX ENDOMETRIAL ABLATION           No current outpatient prescriptions on file.     Allergies   Allergen Reactions    Penicillins Hives/ Urticaria       Review of Systems:  Constitutional: negative  HEENT: negative  Cardiac: negative  Respiratory: negative  GI: negative  GU: as above  Neuro: negative  Psych: negative    Physical Exam:  Visit Vitals    BP (!) 159/89    Ht 1.626 m (5\' 4" )    Wt 93.4 kg (206 lb)    LMP 03/19/2015    Breastfeeding No    BMI 35.36 kg/m2     General: alert, oriented, no acute distress  HEENT: thyroid normal size  Breasts: no skin lesions, no nipple retraction, no masses palpable, no nipple discharge  Abdomen: soft, non-tender, non-distended  Pelvic: vulva without lesions, vagina and cervix without lesions, small amount of blood in vault, no cervical motion tenderness, no uterine or adnexal masses or tenderness; bimanual exam limited due to body habitus  Psych: appropriate mood and affect      ICD-10-CM    1. Visit for gynecologic examination Z01.419    2. Encounter for screening mammogram for malignant neoplasm of breast Z12.31 MAMMO BILATERAL SCREENING   3. Uterine leiomyoma, unspecified location D25.9    4. Spotting between menses  N92.0        Pap done  Will return for sono and emb  Discussed expectant management (assuming negative work-up) for menopause versus hysterectomy for definitive management.  She is interested in hysterectomy since bleeding is becoming more bothersome.  Discussed expectation for attempt at tlh but possible need for conversion to open procedure.  Discussed need for at least 6 weeks off from work.  Return in about 2 weeks (around 05/10/2015) for Gyn follow-up with sono.    Greater than 10 minutes was spent in discussion of further work-up and treatment of abnormal bleeding/fibroid.    Leanne Lovely, MD 04/26/2015 14:50

## 2015-05-07 LAB — HISTORICAL CYTOPATHOLOGY-GYN (PAP AND HPV TESTS)

## 2015-05-18 ENCOUNTER — Other Ambulatory Visit (INDEPENDENT_AMBULATORY_CARE_PROVIDER_SITE_OTHER): Payer: BC Managed Care – PPO

## 2015-05-18 ENCOUNTER — Encounter (FREE_STANDING_LABORATORY_FACILITY)
Admit: 2015-05-18 | Discharge: 2015-05-18 | Disposition: A | Discharge status: Home / Self Care | Payer: BC Managed Care – PPO | Attending: Obstetrics & Gynecology | Admitting: Obstetrics & Gynecology

## 2015-05-18 ENCOUNTER — Ambulatory Visit (INDEPENDENT_AMBULATORY_CARE_PROVIDER_SITE_OTHER): Payer: BC Managed Care – PPO | Admitting: Obstetrics & Gynecology

## 2015-05-18 VITALS — BP 149/88 | Ht 64.0 in | Wt 207.0 lb

## 2015-05-18 DIAGNOSIS — R935 Abnormal findings on diagnostic imaging of other abdominal regions, including retroperitoneum: Secondary | ICD-10-CM

## 2015-05-18 DIAGNOSIS — D259 Leiomyoma of uterus, unspecified: Secondary | ICD-10-CM

## 2015-05-18 DIAGNOSIS — N920 Excessive and frequent menstruation with regular cycle: Secondary | ICD-10-CM

## 2015-05-18 DIAGNOSIS — N923 Ovulation bleeding: Secondary | ICD-10-CM

## 2015-05-18 DIAGNOSIS — N926 Irregular menstruation, unspecified: Secondary | ICD-10-CM

## 2015-05-18 DIAGNOSIS — R938 Abnormal findings on diagnostic imaging of other specified body structures: Secondary | ICD-10-CM

## 2015-05-18 NOTE — Procedures (Signed)
Erica Harper is a 55 y.o. undergoing endometrial biopsy for abnormal bleeding, abnormal ultrasound.  Risks/benefits/expectations reviewed with patient and informed consent obtained.  Time out procedure performed.    Speculum inserted and cervix visualized.  Cervix cleansed with Betadine.  Anterior lip of cervix grasped with single tooth tenaculum.  Pipelle introduced through cervix into uterine cavity and passed 2 times.  Adequate appearing amount of tissue obtained.  All instruments removed from vagina.  Pt. Tolerated procedure well    Leanne Lovely, MD 05/18/2015, 09:04

## 2015-05-18 NOTE — Progress Notes (Signed)
The patient is a 55 y/o G2P2 who presents for follow-up.  She was recently seen for annual exam and complained of increased spotting between menses.  She had an ultrasound done last year which showed a fibroid.  She underwent an ultrasound today which appears abnormal: multiple uterine thick walled cystic areas as follows: fundal 3 x 2.9 x 2.5 cm, body 4.6 x 3.7 x 3.2 cm, body 3.9 x 3.9 x 2.1 cm, rt lateral 3.7 x 3.2 x 2.9 cm, endometrium is not seen; ovaries with normal appearance.    emb was done today- see procedure note.  Urine pregnancy test was negative.    We discussed that the ultrasound is unusual in appearance and not diagnostic at this time.  Recommended hysterectomy for definitive management.  Reviewed expectation of attempt at tlh but possible need for conversion to open and risk of bladder injury with history of 2 prior c-sections.    Greater than 8 minutes of 15 minute visit was spent in counseling.

## 2015-05-22 ENCOUNTER — Encounter (INDEPENDENT_AMBULATORY_CARE_PROVIDER_SITE_OTHER): Payer: Self-pay | Admitting: Obstetrics & Gynecology

## 2015-05-22 LAB — HUMAN PAPILLOMA VIRUS (HPV) BY PCR WITH HIGH RISK GENOTYPING (THINPREP)
HPV OTHER: NEGATIVE
HPV16 PCR: NEGATIVE
HPV18 PCR: NEGATIVE

## 2015-05-22 LAB — HISTORICAL SURGICAL PATHOLOGY SPECIMEN

## 2015-05-22 NOTE — Progress Notes (Signed)
Patient informed neg emb and final sono read suggestive of adenomyosis.  She was very relieved.  Will monitor for bleeding over next several months (has not had menses since January) before making decision regarding hysterectomy.

## 2015-05-23 ENCOUNTER — Encounter (INDEPENDENT_AMBULATORY_CARE_PROVIDER_SITE_OTHER): Payer: Self-pay | Admitting: Obstetrics & Gynecology

## 2015-07-25 ENCOUNTER — Ambulatory Visit (HOSPITAL_BASED_OUTPATIENT_CLINIC_OR_DEPARTMENT_OTHER): Payer: Self-pay | Admitting: Obstetrics & Gynecology

## 2015-07-25 ENCOUNTER — Ambulatory Visit (INDEPENDENT_AMBULATORY_CARE_PROVIDER_SITE_OTHER): Payer: Self-pay | Admitting: Obstetrics & Gynecology

## 2015-07-25 NOTE — Telephone Encounter (Signed)
Appointment provided.  Latanya Presser, LPN  [id number], [phone]

## 2015-07-25 NOTE — Telephone Encounter (Signed)
Regarding: Return call requested re: tests  ----- Message from Laverda Sorenson sent at 07/25/2015  3:40 PM EDT -----      ----- Message from Laverda Sorenson sent at 07/25/2015 11:50 AM EDT -----  Erica Harper Pt    Pt would like a return call re: some recent tests.      Thank you    Jenny Reichmann

## 2015-07-25 NOTE — Telephone Encounter (Signed)
This pt is being seen at [facility] office

## 2015-07-25 NOTE — Telephone Encounter (Signed)
Regarding: Return call requested re: tests  ----- Message from Laverda Sorenson sent at 07/25/2015 11:50 AM EDT -----  Mace Pt    Pt would like a return call re: some recent tests.      Thank you    Jenny Reichmann

## 2015-07-27 ENCOUNTER — Ambulatory Visit (INDEPENDENT_AMBULATORY_CARE_PROVIDER_SITE_OTHER): Payer: BC Managed Care – PPO | Admitting: Obstetrics & Gynecology

## 2015-07-30 ENCOUNTER — Ambulatory Visit (INDEPENDENT_AMBULATORY_CARE_PROVIDER_SITE_OTHER): Payer: BC Managed Care – PPO | Admitting: Obstetrics & Gynecology

## 2015-07-30 VITALS — BP 137/81 | Ht 64.0 in | Wt 210.0 lb

## 2015-07-30 DIAGNOSIS — N8003 Adenomyosis of the uterus: Secondary | ICD-10-CM

## 2015-07-30 DIAGNOSIS — N939 Abnormal uterine and vaginal bleeding, unspecified: Principal | ICD-10-CM

## 2015-07-30 DIAGNOSIS — Z6836 Body mass index (BMI) 36.0-36.9, adult: Secondary | ICD-10-CM

## 2015-07-30 DIAGNOSIS — N809 Endometriosis, unspecified: Secondary | ICD-10-CM

## 2015-07-30 DIAGNOSIS — N800 Endometriosis of uterus: Secondary | ICD-10-CM

## 2015-07-30 MED ORDER — MEDROXYPROGESTERONE 10 MG TABLET
10.0000 mg | ORAL_TABLET | Freq: Every day | ORAL | 1 refills | Status: DC
Start: 2015-07-30 — End: 2016-03-11

## 2015-07-30 NOTE — Progress Notes (Signed)
The patient is a 55 y/o G2P2 who presents for abnormal bleeding.  She has a history of a failed novasure (with Dr. Rayburn Felt) due to fibroids.  In March she had an emb which was normal and an ultrasound which appeared abnormal but final read was consistent with adenomyosis.  She opted for conservative management since she had not had bleeding for several months since that time.  More recently she had heavy bleeding for 2-3 days last month and has had lighter bleeding for the past 15 days.    She was interested in trying ocps but discussed risks outweigh potential benefit at her age.  Recommended further work-up with d&c/hysteroscopy if she would still like to avoid hysterectomy.  Rx for provera in case heavy bleeding starts again.    Will try to plan for hysteroscopy/d&c in the near future while continuing to observe for bleeding.    Greater than 8 minutes of 15 minute visit was spent in discussion of treatment plan/options.

## 2015-08-22 ENCOUNTER — Encounter (INDEPENDENT_AMBULATORY_CARE_PROVIDER_SITE_OTHER): Payer: BC Managed Care – PPO | Admitting: Obstetrics & Gynecology

## 2015-09-07 ENCOUNTER — Ambulatory Visit (INDEPENDENT_AMBULATORY_CARE_PROVIDER_SITE_OTHER): Payer: Self-pay | Admitting: Obstetrics & Gynecology

## 2015-09-07 NOTE — Telephone Encounter (Signed)
Patient called about heavy bleeding changing pad and tampon every 45 min. Notes from 07/30/15 from Dr. Freddi Che states for her to start provera for heavy bleeding. Prescription was called in to [facility]. If severe bleeding continues patient to go to ER and make appointment with Dr. Freddi Che.Erica Cava, LPN  [id number], [phone]

## 2015-09-10 ENCOUNTER — Ambulatory Visit (INDEPENDENT_AMBULATORY_CARE_PROVIDER_SITE_OTHER): Payer: Self-pay | Admitting: Obstetrics & Gynecology

## 2015-09-10 NOTE — Telephone Encounter (Signed)
Returned call pt states provera help, explained I spoke with Dr Freddi Che and we need to reschedule her pre-op pt states she has appointment with another office this PM at 1400.  Latanya Presser, LPN  [id number], [id number]

## 2015-09-10 NOTE — Telephone Encounter (Signed)
Regarding: Pt wanting to schedule to be seen  ----- Message from Gwenith Spitz sent at 09/07/2015  4:05 PM EDT -----  Pt would like to talk to a nurse about seeing Dr Freddi Che Monday. Please call pt to advise.    ----- Message from Laverda Sorenson sent at 09/07/2015  1:17 PM EDT -----  Mace Pt    Pt states she has been bleeding x45 minutes and she had to change a super tampon and a pad.  She would like to see Dr. Freddi Che on Monday.  My first available time is 09.01.17.    Thank you    Jenny Reichmann

## 2015-09-12 ENCOUNTER — Encounter (INDEPENDENT_AMBULATORY_CARE_PROVIDER_SITE_OTHER): Payer: Self-pay | Admitting: Obstetrics & Gynecology

## 2015-09-12 NOTE — Progress Notes (Signed)
Normal mammogram from [facility] 08/31/15

## 2015-09-28 ENCOUNTER — Ambulatory Visit: Payer: BC Managed Care – PPO | Attending: GYNECOLOGICAL/ONCOLOGY

## 2015-09-28 DIAGNOSIS — N920 Excessive and frequent menstruation with regular cycle: Secondary | ICD-10-CM | POA: Insufficient documentation

## 2015-09-28 LAB — CBC
HCT: 36.9 % (ref 34.6–46.2)
HGB: 12.1 g/dL (ref 11.8–15.8)
MCH: 29.2 pg (ref 27.6–33.2)
MCHC: 32.9 g/dL (ref 32.6–35.4)
MCV: 88.8 fL (ref 82.3–96.7)
MCV: 88.8 fL (ref 82.3–96.7)
MPV: 8.5 fL (ref 6.6–10.2)
PLATELETS: 306 x10ˆ3/uL (ref 140–440)
RBC: 4.15 x10ˆ6/uL (ref 3.80–5.24)
RDW: 13.9 % (ref 12.4–15.2)
WBC: 8.5 x10ˆ3/uL (ref 3.5–10.3)

## 2015-10-12 ENCOUNTER — Emergency Department (HOSPITAL_COMMUNITY): Payer: BC Managed Care – PPO

## 2015-10-12 ENCOUNTER — Emergency Department
Admission: EM | Admit: 2015-10-12 | Discharge: 2015-10-12 | Disposition: A | Discharge status: Home / Self Care | Payer: BC Managed Care – PPO | Attending: Emergency Medicine | Admitting: Emergency Medicine

## 2015-10-12 ENCOUNTER — Emergency Department (HOSPITAL_COMMUNITY)
Admission: RE | Admit: 2015-10-12 | Discharge: 2015-10-12 | Disposition: A | Discharge status: Home / Self Care | Payer: BC Managed Care – PPO | Source: Ambulatory Visit | Admitting: Radiology

## 2015-10-12 ENCOUNTER — Encounter (HOSPITAL_COMMUNITY): Payer: Self-pay | Admitting: Family

## 2015-10-12 DIAGNOSIS — I80201 Phlebitis and thrombophlebitis of unspecified deep vessels of right lower extremity: Secondary | ICD-10-CM

## 2015-10-12 DIAGNOSIS — I82811 Embolism and thrombosis of superficial veins of right lower extremities: Secondary | ICD-10-CM | POA: Insufficient documentation

## 2015-10-12 MED ORDER — KETOROLAC 60 MG/2 ML INTRAMUSCULAR SOLUTION
60.0000 mg | INTRAMUSCULAR | Status: DC
Start: 2015-10-12 — End: 2015-10-12

## 2015-10-12 NOTE — Discharge Instructions (Signed)
APPLY WARM COMPRESSES TO AREA SEVERAL TIMES A DAY.  THIS WILL DECREASE THE SWELLING SOONER.   ELEVATE YOUR LEG.  BE SURE TO WALK AROUND EVERY COUPLE OF HOURS.    IF ANY CHEST PAIN, SHORTNESS OF BREATH, OR ANY WORSENING OF CONDITION, RETURN TO THE ER.

## 2015-10-12 NOTE — ED Provider Notes (Signed)
[facility] of [facility]  HPI - 10/12/2015      Attending: Dr. Amada Jupiter  Resident/Advanced Practice Provider:  Arlis Porta, APRN    Chief Complaint:   R leg pain  History of Present Illness:   Erica Harper, 55 y.o. female   Pt reports to the ED via POV with c/o R leg pain. Pt had hysterectomy performed 4 days ago. Notes onset of R upper leg pain one day ago. Associated sx of R leg swelling. Denies SOB, chest pain, and all other sxs at this time. No pertinent PMHx or other pertinent PSHx. Drug allergy to penicillins.     Location: R upper leg   Quality: None specified   Onset: One day ago   Severity: None specified   Timing: None specified   Context: Pt had hysterectomy 4 days ago with onset of R leg pain and swelling one day ago.   Modifying factors: None specified   Associated symptoms: Positive for R leg swelling. Negative for SOB and chest pain.    Review of Systems:   Constitutional: No fever, chills, or weakness   Skin: No rashes or diaphoresis   HENT: No congestion   Eyes: No vision changes, discharge   Cardio: No chest pain, palpitations or leg swelling    Respiratory: No cough, wheezing or SOB   GI:  No nausea, vomiting, diarrhea, constipation or abdominal pain   GU:  No dysuria, hematuria, polyuria   MSK: No joint or back pain +R leg pain +R leg swelling   Neuro: No loss of sensation, focal deficits, headaches, or LOC  All other symptoms reviewed and are negative    Medications:  Prior to Admission Medications   Prescriptions Last Dose Informant Patient Reported? Taking?   medroxyPROGESTERone (PROVERA) 10 mg Oral Tablet   No No   Sig: Take 1 Tab (10 mg total) by mouth Once a day      Facility-Administered Medications: None       Allergies:  Allergies   Allergen Reactions   . Penicillins Hives/ Urticaria       Past Medical History:  Past Medical History:   Diagnosis Date   . Fibroids            Past Surgical History:  Past Surgical History:      Procedure Laterality Date   . HX ADENOIDECTOMY     . HX CESAREAN SECTION      x2   . HX ENDOMETRIAL ABLATION             Social History:  Social History     Social History   . Marital status: Married     Spouse name: N/A   . Number of children: N/A   . Years of education: N/A     Occupational History   .  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       sp:  [employer] History Main Topics   . Smoking status: Never Smoker   . Smokeless tobacco: Not on file   . Alcohol use No   . Drug use: Not on file   . Sexual activity: Yes     Partners: Male     Birth control/ protection: Vasectomy     Other Topics Concern   . Abuse/Domestic Violence No   . Breast Self Exam Yes   . Calcium Intake Adequate Yes   .  Seat Belt Yes     Social History Narrative       Family History:  Family History   Problem Relation Age of Onset   . Diabetes Maternal Grandfather    . Breast Cancer Maternal Grandmother 96   . Heart Disease Mother 32     stents   . Hypertension Mother            Physical Exam:  All nurse's notes reviewed.  Filed Vitals:    10/12/15 1558   BP: (!) 145/76   Pulse: 99   Resp: 18   Temp: 37.5 C (99.5 F)   SpO2: 98%        Constitutional: NAD. A+Ox3   HENT:    Head: NC AT    Mouth/Throat: Oropharynx is clear and moist.    Eyes: PERRL, EOMI, Conjunctivae without discharge.   Neck: Supple.    Cardiovascular: RRR, No murmurs, rubs, or gallops.    Pulmonary/Chest: BS equal bilaterally, good air movement. No respiratory distress. No wheezes, rales, or chest tenderness.    Abdominal: BS +. Abdomen soft. No tenderness, rebound, or guarding.                Musculoskeletal: No obvious deformity or swelling noted.     Skin: Warm and dry. No rash, pallor, or cyanosis. 3 cm circumferential, erythematous hematoma to R anterior thigh. Area is not warmer than surrounding skin.   Psychiatric: Behavior is normal. Mood and affect congruent.     Neurological: Alert&Ox3. Grossly intact.         Labs:  No results found for this or any previous visit (from the past 24 hour(s)).    Imaging:  None Indicated         Orders Placed This Encounter   . PERIPHERAL VENOUS DUPLEX - LOWER       Abnormal Lab results:  Labs Reviewed - No data to display    Plan: Appropriate labs and imaging ordered. Medical Records reviewed.    Therapy/Procedures/Course/MDM:    Administered Toradol injection in ED.   Instructed to apply warm compress to area several times a day to decrease swelling.   Instructed to keep leg elevated.   Instructed to walk around every couple of hours.   Recommended to come back to ED if experiencing chest pain, shortness of breath, or any worsening of symptoms.   Consults:    None  Impression:   Encounter Diagnosis   Name Primary?   . Thrombophlebitis of deep vein of right thigh ([place]) Yes     Disposition:  Discharged     Following the above history, physical exam, and studies, the patient was deemed stable and suitable for discharge.   she will follow up with PCP as needed.    It was advised that the patient return to the ED with any new, concerning or worsening symptoms and follow up as directed.    The patient verbalized understanding of all instructions and had no further questions or concerns.     I am scribing for, and in the presence of, Arlis Porta, APRN  for services provided on 10/12/2015.  Tori Nesmith, SCRIBE     I personally performed the services described in this documentation, as scribed  in my presence, and it is both accurate  and complete.    Arlis Porta, APRN  Supervising physician was physically present in [facility] ED and available for consultation and did not participate in the care of this patient.  Arlis Porta, APRN

## 2015-11-12 ENCOUNTER — Ambulatory Visit (HOSPITAL_COMMUNITY): Payer: BC Managed Care – PPO

## 2015-11-12 ENCOUNTER — Ambulatory Visit: Payer: BC Managed Care – PPO | Attending: Family Medicine | Admitting: Family Medicine

## 2015-11-12 DIAGNOSIS — R1011 Right upper quadrant pain: Secondary | ICD-10-CM | POA: Insufficient documentation

## 2015-11-12 LAB — CBC WITH DIFF
BASOPHIL #: 0 x10ˆ3/uL (ref 0.00–0.20)
BASOPHIL %: 1 %
EOSINOPHIL #: 0.2 x10ˆ3/uL (ref 0.00–0.50)
EOSINOPHIL %: 3 %
HCT: 36 % (ref 34.6–46.2)
HGB: 11.8 g/dL (ref 11.8–15.8)
LYMPHOCYTE #: 2 x10ˆ3/uL (ref 0.90–3.40)
LYMPHOCYTE %: 27 %
MCH: 28.1 pg (ref 27.6–33.2)
MCHC: 32.7 g/dL (ref 32.6–35.4)
MCV: 85.9 fL (ref 82.3–96.7)
MONOCYTE #: 0.6 x10ˆ3/uL (ref 0.20–0.90)
MONOCYTE %: 8 %
MPV: 9.6 fL (ref 6.6–10.2)
NEUTROPHIL #: 4.5 x10?3/uL (ref 1.50–6.40)
NEUTROPHIL #: 4.5 x10ˆ3/uL (ref 1.50–6.40)
NEUTROPHIL %: 62 %
NEUTROPHIL %: 62 %
PLATELETS: 300 x10ˆ3/uL (ref 140–440)
RBC: 4.19 x10ˆ6/uL (ref 3.80–5.24)
RDW: 15.1 % (ref 12.4–15.2)
WBC: 7.3 x10ˆ3/uL (ref 3.5–10.3)

## 2015-11-12 LAB — COMPREHENSIVE METABOLIC PANEL, NON-FASTING
ALBUMIN: 4.2 g/dL (ref 3.5–4.8)
ALKALINE PHOSPHATASE: 85 U/L (ref 20–130)
ALT (SGPT): 22 U/L (ref 7–42)
ANION GAP: 12 mmol/L
AST (SGOT): 21 U/L (ref 12–35)
BILIRUBIN TOTAL: 0.4 mg/dL (ref 0.3–1.2)
BUN/CREA RATIO: 9
BUN/CREA RATIO: 9
BUN: 8 mg/dL (ref 8–20)
CALCIUM: 9.6 mg/dL (ref 8.9–10.3)
CHLORIDE: 102 mmol/L (ref 101–111)
CO2 TOTAL: 25 mmol/L (ref 22–32)
CREATININE: 0.9 mg/dL (ref 0.6–1.2)
ESTIMATED GFR: 65 mL/min/1.73m?2
ESTIMATED GFR: 65 mL/min/1.73mˆ2
GLUCOSE: 92 mg/dL (ref 70–110)
POTASSIUM: 3.8 mmol/L (ref 3.6–5.1)
PROTEIN TOTAL: 7.2 g/dL (ref 6.4–8.3)
SODIUM: 139 mmol/L (ref 136–144)
SODIUM: 139 mmol/L (ref 136–144)

## 2015-11-14 ENCOUNTER — Other Ambulatory Visit (INDEPENDENT_AMBULATORY_CARE_PROVIDER_SITE_OTHER): Payer: Self-pay | Admitting: Family Medicine

## 2015-11-14 DIAGNOSIS — R1011 Right upper quadrant pain: Secondary | ICD-10-CM

## 2015-11-28 ENCOUNTER — Ambulatory Visit
Admission: RE | Admit: 2015-11-28 | Discharge: 2015-11-28 | Disposition: A | Discharge status: Home / Self Care | Payer: BC Managed Care – PPO | Source: Ambulatory Visit | Attending: Family Medicine | Admitting: Family Medicine

## 2015-11-28 DIAGNOSIS — K824 Cholesterolosis of gallbladder: Secondary | ICD-10-CM | POA: Insufficient documentation

## 2015-11-28 DIAGNOSIS — R1011 Right upper quadrant pain: Secondary | ICD-10-CM | POA: Insufficient documentation

## 2015-11-28 DIAGNOSIS — K7689 Other specified diseases of liver: Secondary | ICD-10-CM | POA: Insufficient documentation

## 2016-01-02 ENCOUNTER — Other Ambulatory Visit (HOSPITAL_BASED_OUTPATIENT_CLINIC_OR_DEPARTMENT_OTHER): Payer: Self-pay | Admitting: Surgery

## 2016-02-01 ENCOUNTER — Other Ambulatory Visit (HOSPITAL_BASED_OUTPATIENT_CLINIC_OR_DEPARTMENT_OTHER): Payer: Self-pay | Admitting: Surgery

## 2016-03-11 ENCOUNTER — Ambulatory Visit (INDEPENDENT_AMBULATORY_CARE_PROVIDER_SITE_OTHER): Payer: BC Managed Care – PPO | Admitting: Family Medicine

## 2016-03-11 ENCOUNTER — Telehealth (INDEPENDENT_AMBULATORY_CARE_PROVIDER_SITE_OTHER): Payer: Self-pay | Admitting: Family Medicine

## 2016-03-11 ENCOUNTER — Ambulatory Visit: Payer: BC Managed Care – PPO | Attending: Family Medicine

## 2016-03-11 ENCOUNTER — Encounter (INDEPENDENT_AMBULATORY_CARE_PROVIDER_SITE_OTHER): Payer: Self-pay | Admitting: Family Medicine

## 2016-03-11 VITALS — BP 112/82 | HR 102 | Temp 99.1°F | Resp 18 | Ht 64.0 in

## 2016-03-11 DIAGNOSIS — R509 Fever, unspecified: Secondary | ICD-10-CM

## 2016-03-11 DIAGNOSIS — J020 Streptococcal pharyngitis: Secondary | ICD-10-CM

## 2016-03-11 DIAGNOSIS — Z1211 Encounter for screening for malignant neoplasm of colon: Secondary | ICD-10-CM

## 2016-03-11 LAB — INFLUENZA VIRUS TYPE A AND TYPE B, PCR
INFLUENZA VIRUS TYPE A: NEGATIVE
INFLUENZA VIRUS TYPE B: NEGATIVE

## 2016-03-11 MED ORDER — CLARITHROMYCIN 500 MG TABLET: 500 mg | Tab | Freq: Two times a day (BID) | ORAL | 0 refills | 0 days | Status: DC

## 2016-03-11 NOTE — Telephone Encounter (Signed)
Pt recently to over the flu, I asked if she had tested positive for flu and she said she was too sick to go to doctor to get tested, but she is feeling a little better, had fever all day yesterday,but now she has swollen glands, white on her tonsils, and wanted to know if she could possibly come to be seen today, she wants to get better so she can get back to work, pt works at an [employer].  Can call her back at [phone]

## 2016-03-11 NOTE — Telephone Encounter (Signed)
Patient notified and voiced understanding. Flu test ordered. Patient will be here at 1:45pm

## 2016-03-11 NOTE — Telephone Encounter (Signed)
Patient should still be seen since she is sick

## 2016-03-11 NOTE — Progress Notes (Signed)
[facility]  [address]  [place] [address]        Encounter Date: 03/11/2016  1:45 PM EST      Name: Erica Harper  Age: 56 y.o.  DOB: [birth year]  Sex: female    Chief Complaint: No chief complaint on file.      HPI   Patient is here for sick visit.  Patient reports that she began having fevers and chills over the weekend.  At that time she felt very tired.  Patient now has a sore throat that started yesterday.  She complains of sore lymph nodes in her neck.  She also noticed white pus on her tonsils.  Patient did go to the hospital today and was tested for the flu.  Her results are negative.      History   Family Medical History     Problem Relation (Age of Onset)    Breast Cancer Maternal Grandmother (96)    Cancer Father    Diabetes Maternal Grandfather    Heart Disease Mother (63)    Hypertension Mother, Father        Past Medical History:   Diagnosis Date    Rosacea      Past Surgical History:   Procedure Laterality Date    HX ADENOIDECTOMY      HX CESAREAN SECTION      x2    HX ENDOMETRIAL ABLATION      HX HYSTERECTOMY       There are no active problems to display for this patient.    Current Outpatient Prescriptions   Medication Sig    clarithromycin (BIAXIN) 500 mg Oral Tablet Take 1 Tab (500 mg total) by mouth Twice daily    metroNIDAZOLE (METROGEL) 0.75 % Gel by Apply Topically route Twice daily - in morning and at bedtime     History   Smoking Status    Never Smoker   Smokeless Tobacco    Never Used     History   Alcohol Use    Yes     Comment: Social     History   Drug Use No       Review of Systems   Constitutional: Positive for chills. Negative for diaphoresis.   HENT: Negative for congestion and sinus pressure.    Eyes: Negative for visual disturbance.   Respiratory: Negative for cough and shortness of breath.    Cardiovascular: Negative for chest pain and palpitations.   Gastrointestinal: Negative for abdominal pain and blood in stool.   Endocrine: Negative for cold  intolerance.   Genitourinary: Negative for dysuria and pelvic pain.   Musculoskeletal: Negative for arthralgias and gait problem.   Skin: Negative for rash.   Neurological: Negative for dizziness and weakness.   Hematological: Does not bruise/bleed easily.   Psychiatric/Behavioral: Negative for dysphoric mood. The patient is not nervous/anxious.         Examination  Vitals: BP 112/82   Pulse (!) 102   Temp 37.3 C (99.1 F)   Resp 18   Ht 1.626 m (5\' 4" )   SpO2 95%  Physical Exam   Constitutional: She is oriented to person, place, and time. She appears well-developed. No distress.   HENT:   Head: Normocephalic and atraumatic.   Patient has diffuse posterior pharyngeal erythema.  She did have white tonsillar exudate on the right posterior side.   Eyes: Conjunctivae and EOM are normal.   Neck: Normal range of motion.  Tender anterior cervical lymphadenopathy   Cardiovascular: Regular rhythm.    Mildly tachycardic 100.   Pulmonary/Chest: Effort normal and breath sounds normal. She has no wheezes.   Abdominal: Soft. Bowel sounds are normal. She exhibits no distension. There is no tenderness.   Musculoskeletal: She exhibits no edema.   Lymphadenopathy:     She has cervical adenopathy.   Neurological: She is alert and oriented to person, place, and time. No cranial nerve deficit.   Skin: Skin is warm and dry.   Psychiatric: She has a normal mood and affect. Her behavior is normal.   Nursing note and vitals reviewed.      Assessment and Plan  Strep pharyngitis  Patient refused a rapid strep test.  Based on clinical signs and symptoms, patient was diagnosed with strep throat.  A prescription for Biaxin 500 mg twice a day was sent to the pharmacy due to her penicillin allergy. Patient was told to follow up if no improvement.    Screening for malignant neoplasm of colon  -     Refer to [facility]; Future    Return in about 6 months (around 09/08/2016) for physical.    [name], MD

## 2016-03-11 NOTE — Telephone Encounter (Signed)
Patient states that she has not been running a fever since yesterday and is no longer feeling achy. She says she does not believe she has the flu and does not want to go to get tested and be around all those sick people. Advised that we would call her back if Dr. Mena Goes still wants her to get tested before she comes in.

## 2016-03-11 NOTE — Telephone Encounter (Signed)
Patient wants to know if she still needs to be seen? She wanted to know if Dr Mena Goes received test results? She is here now

## 2016-03-11 NOTE — Telephone Encounter (Signed)
Come in at 1:45 and get flu testing prior to that.  We can fax order to hospital

## 2016-03-13 ENCOUNTER — Other Ambulatory Visit (INDEPENDENT_AMBULATORY_CARE_PROVIDER_SITE_OTHER): Payer: Self-pay | Admitting: Family Medicine

## 2016-03-13 MED ORDER — FLUCONAZOLE 150 MG TABLET
150.0000 mg | ORAL_TABLET | Freq: Once | ORAL | 0 refills | Status: AC
Start: 2016-03-13 — End: 2016-03-13

## 2016-03-13 NOTE — Telephone Encounter (Signed)
Pt.notified

## 2016-03-13 NOTE — Telephone Encounter (Signed)
Patient has been on antibiotic for a couple days and she is getting a yeast infection and wanting to know if something could be called in?  [facility]

## 2016-03-13 NOTE — Telephone Encounter (Signed)
Please add Diflucan 150mg  one today and repeat in 2 days so I can eprescribe it

## 2016-05-12 ENCOUNTER — Encounter (HOSPITAL_BASED_OUTPATIENT_CLINIC_OR_DEPARTMENT_OTHER): Payer: Self-pay | Admitting: Surgery

## 2016-05-14 ENCOUNTER — Encounter (HOSPITAL_BASED_OUTPATIENT_CLINIC_OR_DEPARTMENT_OTHER): Payer: Self-pay | Admitting: Surgery

## 2016-05-14 ENCOUNTER — Ambulatory Visit: Payer: BC Managed Care – PPO | Attending: Surgery | Admitting: Surgery

## 2016-05-14 VITALS — BP 120/78 | Ht 64.0 in | Wt 214.0 lb

## 2016-05-14 DIAGNOSIS — Z6836 Body mass index (BMI) 36.0-36.9, adult: Secondary | ICD-10-CM

## 2016-05-14 DIAGNOSIS — Z1211 Encounter for screening for malignant neoplasm of colon: Principal | ICD-10-CM

## 2016-05-14 DIAGNOSIS — Z01818 Encounter for other preprocedural examination: Secondary | ICD-10-CM

## 2016-05-14 MED ORDER — SODIUM,POTASSIUM,MAG SULFATES 17.5 GRAM-3.13 GRAM-1.6 GRAM ORAL SOLN: 1 | Bottle | Freq: Two times a day (BID) | ORAL | 0 refills | 0 days | Status: DC

## 2016-05-14 NOTE — Patient Instructions (Signed)
Pre-operative Education for [facility] and Bowel Surgery  I reviewed the following with the patient/family:   Discussion of Diet, handout given to patient to follow prior to procedure.   Medication reviewed with patient, verbal instructions and handout was given to   patient indicating Medication to continue and Medication that must be stopped.   Bowel Prep Instructions reviewed and a paper handout was included in the patient  packet.   Date and Time of Procedure and arrive time discussed with patient and included in  Packet   Pre-op testing and time frame to have test completed were discussed and written on order form in patient packet.

## 2016-05-14 NOTE — H&P (Signed)
[facility] History & Physical    Patient: Erica Harper  D.O.B.: 1961-02-13  MRN# [id number]  Date of Service: 05/14/2016  REFERRING PROVIDER: Lear Ng Woofter    HPI  Erica Harper is a 56 y.o. female who was seen today for Colonoscopy. She denies abdominal pain, blood in stool or changes in bowel habits. No family history of colon cancer.     Past Medical History:   Diagnosis Date   . Rosacea           Past Surgical History:   Procedure Laterality Date   . HX ADENOIDECTOMY     . HX CESAREAN SECTION      x2   . HX ENDOMETRIAL ABLATION     . HX HYSTERECTOMY            Current Outpatient Prescriptions   Medication Sig   . ASCORBIC ACID/MULTIVIT-MIN (EMERGEN-C ORAL) Take by mouth   . multivitamin Oral Tablet Take 1 Tab by mouth Once a day   . Sodium,Potassium,&Mag Sulfates (SUPREP BOWEL PREP KIT) 17.5-3.13-1.6 gram Oral Recon Soln Take 1 Bottle by mouth Every 12 hours     Allergies   Allergen Reactions   . Penicillins Hives/ Urticaria      Social History   Substance Use Topics   . Smoking status: Never Smoker   . Smokeless tobacco: Never Used   . Alcohol use Yes      Comment: Social     History   Drug Use No      Family Medical History     Problem Relation (Age of Onset)    Breast Cancer Maternal Grandmother (96)    Cancer Father    Diabetes Maternal Grandfather    Heart Disease Mother (105)    Hypertension Mother, Father                ROS:  General: Denies fever, chills, or any changes in weight.  EENT: Denies blurry vision, hearing loss.  Cardiovascular: Denies chest pain or palpitations.  Respiratory: Denies SOB, cough.  Gastrointestinal: Denies abdominal pain, N/V, constipation, or diarrhea. Denies any change in bowel movements or rectal bleeding.  GU: Denies dysuria, change in urinary habits, or hematuria.  Musculoskeletal: Denies weakness or joint pain. Denies trouble moving extremities.  Neurological: Denies HA, dizziness.  Hematologic: Denies hx of bleeding disorders or blood clots.  Psychiatric:  Denies changes in mood, anxiety, or depression.    Objective:  BP 120/78  Ht 1.626 m (_0 )  Wt 97.1 kg (214 lb)  LMP 03/19/2015  BMI 36.73 kg/m2    Physical Exam:  Constitutional:  Alert, healthy, well nourished, no acute distress and not in pain.  HEENT: Normocephalic, atraumatic.   Neck: Trachea midline, supple.  Pulmonary: Normal effort and rate observed.  Cardiovascular: Regular rate and rhythm.  Abdomen: Abdomen soft and supple; No tenderness. Non-distended; No masses present. Bowel sounds normal and active in all 4 quadrants.  Extremities: No peripheral edema; No cyanosis or clubbing of nails.  Musculoskeletal: Normal muscle strength and tone of all four extremities.  Skin: No rashes or lesions present; Warm and dry. No jaundice.  Psychiatric: Normal mood and affect; Judgement and thought content normal.      Assessment:  ENCOUNTER DIAGNOSES     ICD-10-CM   1. Screening for malignant neoplasm of colon Z12.11   2. Pre-op testing Z01.818        Plan:   Orders Placed This Encounter   Procedures   .  LOWER ENDOSCOPY ([facility])     Standing Status:   Future     Standing Expiration Date:   05/14/2017     Order Specific Question:   Type of process requested     Answer:   COLONOSCOPY     Order Specific Question:   Serv. Provider     Answer:   Rowe Clack     Order Specific Question:   Stop anticoagulation therapy     Answer:   Other     Order Specific Question:   Medical Clearance     Answer:   N/A     Order Specific Question:   Type of anesthesia     Answer:   MAC   . CBC/DIFF     Standing Status:   Future     Standing Expiration Date:   07/14/2017   . COMPREHENSIVE METABOLIC PANEL, NON-FASTING     Standing Status:   Future     Standing Expiration Date:   07/14/2017     Medication Orders   Medications   . Sodium,Potassium,&Mag Sulfates (SUPREP BOWEL PREP KIT) 17.5-3.13-1.6 gram Oral Recon Soln     Sig: Take 1 Bottle by mouth Every 12 hours     Dispense:  1 Bottle     Refill:  0     Patient for colonoscopy    #1 I  explained the risks of colonoscopy to the patient including bleeding, infection, missed polyps, inability to attain the cecum and need for barium enema, possibility of perforation and need for emergency surgery etc. The patient agrees to proceed.     #2 The patient will have preoperative bowel prep and laboratory studies.   Return if symptoms worsen or fail to improve.  She was given the opportunity to ask questions and those questions were appropriately answered. She agreed with the treatment plan and is encouraged to call with any additional questions or concerns.       Dorthula Perfect, LPN   I am scribing for, and in the presence of Dr. Marguerita Beards for services provided on 05/14/2016.  Dorthula Perfect, LPN       I personally performed the services described in this documentation, as scribed  in my presence, and it is both accurate  and complete.    Marguerita Beards, MD

## 2016-08-15 ENCOUNTER — Ambulatory Visit (INDEPENDENT_AMBULATORY_CARE_PROVIDER_SITE_OTHER): Payer: BC Managed Care – PPO | Admitting: Family Medicine

## 2016-08-15 ENCOUNTER — Other Ambulatory Visit: Payer: BC Managed Care – PPO | Attending: Family Medicine

## 2016-08-15 ENCOUNTER — Encounter (INDEPENDENT_AMBULATORY_CARE_PROVIDER_SITE_OTHER): Payer: Self-pay | Admitting: Family Medicine

## 2016-08-15 VITALS — BP 118/80 | HR 78 | Temp 97.9°F | Resp 18 | Ht 64.0 in | Wt 195.0 lb

## 2016-08-15 DIAGNOSIS — Z0000 Encounter for general adult medical examination without abnormal findings: Secondary | ICD-10-CM | POA: Insufficient documentation

## 2016-08-15 DIAGNOSIS — N952 Postmenopausal atrophic vaginitis: Secondary | ICD-10-CM

## 2016-08-15 DIAGNOSIS — Z1239 Encounter for other screening for malignant neoplasm of breast: Secondary | ICD-10-CM

## 2016-08-15 LAB — COMPREHENSIVE METABOLIC PANEL, NON-FASTING
ALBUMIN: 4.4 g/dL (ref 3.2–4.6)
ALKALINE PHOSPHATASE: 101 U/L (ref 20–130)
ALT (SGPT): 18 U/L (ref <=52)
ANION GAP: 6 mmol/L
AST (SGOT): 20 U/L (ref <=35)
BILIRUBIN TOTAL: 0.5 mg/dL (ref 0.3–1.2)
BUN/CREA RATIO: 12
BUN: 10 mg/dL (ref 10–25)
CALCIUM: 9.8 mg/dL (ref 8.2–10.2)
CHLORIDE: 103 mmol/L (ref 98–111)
CO2 TOTAL: 30 mmol/L (ref 21–35)
CREATININE: 0.84 mg/dL (ref <=1.30)
ESTIMATED GFR: >60 mL/min/1.73mˆ2
GLUCOSE: 92 mg/dL (ref 70–110)
POTASSIUM: 4.2 mmol/L (ref 3.5–5.0)
PROTEIN TOTAL: 7.6 g/dL (ref 6.0–8.3)
SODIUM: 139 mmol/L (ref 135–145)

## 2016-08-15 LAB — CBC WITH DIFF
BASOPHIL #: 0 x10ˆ3/uL (ref 0.00–0.20)
BASOPHIL %: 1 %
BASOPHIL %: 1 %
EOSINOPHIL #: 0.2 x10ˆ3/uL (ref 0.00–0.50)
EOSINOPHIL %: 4 %
HCT: 43.1 % (ref 34.6–46.2)
HGB: 14.3 g/dL (ref 11.8–15.8)
LYMPHOCYTE #: 1.7 x10ˆ3/uL (ref 0.90–3.40)
LYMPHOCYTE %: 33 %
MCH: 29.8 pg (ref 27.6–33.2)
MCHC: 33.2 g/dL (ref 32.6–35.4)
MCHC: 33.2 g/dL (ref 32.6–35.4)
MCV: 89.8 fL (ref 82.3–96.7)
MONOCYTE #: 0.5 x10ˆ3/uL (ref 0.20–0.90)
MONOCYTE %: 10 %
MPV: 10 fL (ref 6.6–10.2)
NEUTROPHIL #: 2.8 10*3/uL (ref 1.50–6.40)
NEUTROPHIL %: 53 %
PLATELETS: 290 x10ˆ3/uL (ref 140–440)
RBC: 4.8 10*6/uL (ref 3.80–5.24)
RBC: 4.8 x10ˆ6/uL (ref 3.80–5.24)
RDW: 14.7 % (ref 12.4–15.2)
WBC: 5.2 x10ˆ3/uL (ref 3.5–10.3)

## 2016-08-15 LAB — LIPID PANEL
CHOLESTEROL: 202 mg/dL — ABNORMAL HIGH (ref 0–199)
HDL CHOL: 78 mg/dL (ref >40)
LDL DIRECT: 111 mg/dL — ABNORMAL HIGH (ref 0–99)
TRIGLYCERIDES: 50 mg/dL (ref 0–199)
VLDL CALC: 10 mg/dL (ref 0–50)

## 2016-08-15 LAB — THYROID STIMULATING HORMONE (SENSITIVE TSH): TSH: 1.696 u[IU]/mL (ref 0.450–5.330)

## 2016-08-15 MED ORDER — ESTRADIOL 0.01% (0.1 MG/GRAM) VAGINAL CREAM: g | VAGINAL | 2 refills | 0 days | Status: DC

## 2016-08-15 NOTE — Progress Notes (Signed)
[facility]  [address]  [place] [address]        Encounter Date: 08/15/2016 10:15 AM EDT      Name: Erica Harper  Age: 56 y.o.  DOB: [birth year]  Sex: female    Chief Complaint:   Chief Complaint   Patient presents with   . Annual Exam       HPI   Patient is here for her annual physical exam.  She does complain of vaginal dryness and irritation.  She has occasional pain during sexual intercourse.  Patient has tried over-the-counter lubricant with mild relief.    History   Family Medical History     Problem Relation (Age of Onset)    Breast Cancer Maternal Grandmother (96)    Cancer Father    Diabetes Maternal Grandfather    Heart Disease Mother (25)    Hypertension Mother, Father        Past Medical History:   Diagnosis Date   . Rosacea      Past Surgical History:   Procedure Laterality Date   . HX ADENOIDECTOMY     . HX CESAREAN SECTION      x2   . HX ENDOMETRIAL ABLATION     . HX HYSTERECTOMY  2017    complete     Patient Active Problem List    Diagnosis   . Atrophic vaginitis     Current Outpatient Prescriptions   Medication Sig   . ASCORBIC ACID/MULTIVIT-MIN (EMERGEN-C ORAL) Take by mouth   . estradiol (ESTRACE) 0.01 % (0.1 mg/gram) Vaginal Cream Insert 0.5gm into vagina qhs x 2 weeks and then twice a day   . multivitamin Oral Tablet Take 1 Tab by mouth Once a day   . Sodium,Potassium,&Mag Sulfates (SUPREP BOWEL PREP KIT) 17.5-3.13-1.6 gram Oral Recon Soln Take 1 Bottle by mouth Every 12 hours (Patient not taking: Reported on 08/15/2016)     History   Smoking Status   . Never Smoker   Smokeless Tobacco   . Never Used     History   Alcohol Use   . Yes     Comment: Social     History   Drug Use No       Review of Systems   Constitutional: Negative for chills and fever.   HENT: Negative for congestion and sneezing.    Eyes: Negative for visual disturbance.   Respiratory: Negative for cough and shortness of breath.    Cardiovascular: Negative for chest pain and palpitations.   Gastrointestinal:  Negative for abdominal pain and blood in stool.   Endocrine: Negative for cold intolerance.   Genitourinary: Negative for dysuria, pelvic pain and vaginal bleeding.   Musculoskeletal: Negative for arthralgias and gait problem.   Skin: Negative for rash.   Allergic/Immunologic: Negative for environmental allergies.   Neurological: Negative for dizziness, weakness and headaches.   Hematological: Does not bruise/bleed easily.   Psychiatric/Behavioral: Negative for dysphoric mood. The patient is not nervous/anxious.            Examination  Vitals: BP 118/80  Pulse 78  Temp 36.6 C (97.9 F)  Resp 18  Ht 1.626 m (5' 4")  Wt 88.5 kg (195 lb)  LMP 03/19/2015  BMI 33.47 kg/m2  Physical Exam   Constitutional: She is oriented to person, place, and time. She appears well-developed. No distress.   HENT:   Head: Normocephalic and atraumatic.   Mouth/Throat: Oropharynx is clear and moist.   Eyes:  Conjunctivae and EOM are normal. Pupils are equal, round, and reactive to light.   Neck: Normal range of motion. Neck supple.   Cardiovascular: Normal rate and regular rhythm.    Pulmonary/Chest: Effort normal and breath sounds normal. She has no wheezes.   Abdominal: Soft. Bowel sounds are normal. She exhibits no distension. There is no tenderness.   Genitourinary:   Genitourinary Comments: Cervix, uterus, and ovaries are surgically absent.  She does have shiny atrophic vaginal tissue.  No vaginal discharge. No external genital lesions   Musculoskeletal: She exhibits no edema.   Normal gait   Lymphadenopathy:     She has no cervical adenopathy.   Neurological: She is alert and oriented to person, place, and time. No cranial nerve deficit.   Skin: Skin is warm and dry.   Psychiatric: She has a normal mood and affect. Her behavior is normal. Judgment and thought content normal.   Nursing note and vitals reviewed.      Assessment and Plan  Demecia was seen today for annual exam.    Physical exam  BMI addressed: Advised on diet, weight  loss, and exercise to reduce above normal BMI.    -     CBC/DIFF; Future  -     COMPREHENSIVE METABOLIC PANEL, NON-FASTING; Future  -     LIPID PANEL; Future  -     THYROID STIMULATING HORMONE (SENSITIVE TSH); Future    Screening for malignant neoplasm of breast  -     MAMMO BILATERAL SCREENING-ADDL VIEWS/BREAST US AS REQ BY RAD; Future    Atrophic vaginitis  Patient was given a sample of the Estrace vaginal cream.  She understands the possible increased risk for blood clots and/or breast cancer associated with estrogen use.  -     estradiol (ESTRACE) 0.01 % (0.1 mg/gram) Vaginal Cream; Insert 0.5gm into vagina qhs x 2 weeks and then twice a day    She had labs drawn in clinic    Return in about 1 year (around 08/15/2017) for physical.    [name], MD

## 2016-08-18 ENCOUNTER — Telehealth (INDEPENDENT_AMBULATORY_CARE_PROVIDER_SITE_OTHER): Payer: Self-pay | Admitting: Family Medicine

## 2016-08-18 NOTE — Telephone Encounter (Signed)
Patient notified and voiced understanding.

## 2016-08-18 NOTE — Telephone Encounter (Signed)
-----   Message from [name], MD sent at 08/16/2016  9:19 AM EDT -----  1. TSH is within normal limits  2.CBC and Chem 14 are grossly within normal limits  3. LDL is mildly elevated at 111. Continue diet therapy to help get this less than 100.  Good cholesterol looks great at 78

## 2016-08-26 ENCOUNTER — Encounter (HOSPITAL_COMMUNITY): Payer: Self-pay

## 2016-08-28 ENCOUNTER — Ambulatory Visit (HOSPITAL_COMMUNITY): Payer: Self-pay

## 2016-09-01 ENCOUNTER — Ambulatory Visit (HOSPITAL_COMMUNITY): Payer: BC Managed Care – PPO | Admitting: Certified Registered"

## 2016-09-01 ENCOUNTER — Inpatient Hospital Stay
Admission: RE | Admit: 2016-09-01 | Discharge: 2016-09-01 | Disposition: A | Discharge status: Home / Self Care | Payer: BC Managed Care – PPO | Source: Ambulatory Visit | Attending: Surgery | Admitting: Surgery

## 2016-09-01 ENCOUNTER — Ambulatory Visit (HOSPITAL_BASED_OUTPATIENT_CLINIC_OR_DEPARTMENT_OTHER): Payer: BC Managed Care – PPO | Admitting: Certified Registered"

## 2016-09-01 ENCOUNTER — Encounter (HOSPITAL_COMMUNITY)
Admission: RE | Disposition: A | Discharge status: Home / Self Care | Payer: Self-pay | Source: Ambulatory Visit | Attending: Surgery

## 2016-09-01 ENCOUNTER — Encounter (HOSPITAL_COMMUNITY): Payer: Self-pay

## 2016-09-01 DIAGNOSIS — Z1211 Encounter for screening for malignant neoplasm of colon: Secondary | ICD-10-CM | POA: Insufficient documentation

## 2016-09-01 DIAGNOSIS — L719 Rosacea, unspecified: Secondary | ICD-10-CM | POA: Insufficient documentation

## 2016-09-01 DIAGNOSIS — K5730 Diverticulosis of large intestine without perforation or abscess without bleeding: Secondary | ICD-10-CM | POA: Insufficient documentation

## 2016-09-01 SURGERY — COLONOSCOPY
Anesthesia: IV General | Wound class: Clean Contaminated Wounds-The respiratory, GI, Genital, or urinary

## 2016-09-01 MED ORDER — PROPOFOL 10 MG/ML IV BOLUS
INJECTION | Freq: Once | INTRAVENOUS | Status: DC | PRN
Start: 2016-09-01 — End: 2016-09-01
  Administered 2016-09-01: 100 mg via INTRAVENOUS
  Administered 2016-09-01 (×2): 50 mg via INTRAVENOUS

## 2016-09-01 MED ORDER — LACTATED RINGERS INTRAVENOUS SOLUTION
INTRAVENOUS | Status: DC
Start: 2016-09-01 — End: 2016-09-01

## 2016-09-01 MED ORDER — LIDOCAINE (PF) 100 MG/5 ML (2 %) INTRAVENOUS SYRINGE
INJECTION | Freq: Once | INTRAVENOUS | Status: DC | PRN
Start: 2016-09-01 — End: 2016-09-01
  Administered 2016-09-01 (×2): 100 mg via INTRAVENOUS

## 2016-09-01 MED ORDER — SODIUM CHLORIDE 0.9 % (FLUSH) INJECTION SYRINGE
3.0000 mL | INJECTION | INTRAMUSCULAR | Status: DC | PRN
Start: 2016-09-01 — End: 2016-09-01
  Administered 2016-09-01: 3 mL

## 2016-09-01 MED ADMIN — lactated Ringers intravenous solution: INTRAVENOUS | @ 11:00:00 | NDC 00264775000

## 2016-09-01 SURGICAL SUPPLY — 39 items
BRUSH CYTO 1.5MM 140CM BLT TIP ERG HNDL ATO STP SHEATH CLBR STRL DISP PTFE BRONCHSCP (CANNULA) IMPLANT
BRUSH CYTOLOGY BRONCHOSCOPY_ERG HNDL ATO STP SHEATH CLBR (CANNULA)
CATH ELHMST INJ GLD PRB 7FR 25_GA .24MM 210CM BIPO RND DIST (BALLOON)
CATH ELHMST INJ GLD PROBE 7FR 25GA .24MM 210CM BIPOLAR RND DIST TIP STD CONN INTGR DISP 2.8MM MN WRK (BALLOON) IMPLANT
CLIP HMST MR CONDITIONAL BRD CATH ROT CONTROL KNOB NO SHEATH RSL 360 235CM 2.8MM 11MM OPN (SURGICAL INSTRUMENTS) IMPLANT
DEVICE HEMOSTASIS CLIP 360_235CM RESOLUTION (INSTRUMENTS)
DISCONTINUED USE ITEM 328361 - JELLY LUB EZ BCTRST H2O SOL NG_RS FLPTP TUBE STRL 2OZ LF (MISCELLANEOUS PT CARE ITEMS) IMPLANT
FORCEPS BIOPSY HOT 240CM 2.2MM RJ 4 +2.8MM DISP (INSTRUMENTS)
FORCEPS BIOPSY HOT 240CM 2.2MM RJ 4 +2.8MM DISPO (SURGICAL INSTRUMENTS) IMPLANT
FORCEPS BIOPSY HOT 240CM 2.2MM_RJ 4 +2.8MM DISP (INSTRUMENTS)
FORCEPS BIOPSY LRG CPC NEEDLE 240CM 2.2MM RJ 3 DISP ORNG (ENDOSCOPIC SUPPLIES) IMPLANT
FORCEPS BIOPSY NEEDLE 160CM 1.8MM RJ 4 DISP YW 2MM WRK CHNL GSPED (SURGICAL INSTRUMENTS) IMPLANT
FORCEPS BIOPSY NEEDLE 160CM 1._8MM RJ 4 PED 2+ MM DISP (INSTRUMENTS)
FORCEPS BIOPSY NEEDLE 240CM RJ 4 JMB (SURGICAL INSTRUMENTS) IMPLANT
FORCEPS BIOPSY NEEDLE 240CM RJ_4 JMB DISP (INSTRUMENTS)
FORCEPS BIOPSY NEEDLE 240CM RJ_4 LRG CPC DISP (INSTRUMENTS ENDOMECHANICAL)
GW ENDOSCOPIC .035IN 260CM DREAMWIRE ANG RX EGLD NITINOL BIL STRL DISP (ENDOSCOPIC SUPPLIES) IMPLANT
JELLY LUB EZ BCTRST H2O SOL NG_RS FLPTP TUBE STRL 2OZ LF (MISCELLANEOUS PT CARE ITEMS)
KIT CAN MDVC ASCP SPECI CNVRT_NONST LF (TEST)
LIGATOR 2.8MM 8.6-11.5MM SSS7 ESOPH 1 STNG MLT BAND HNDL STRL DISP ENDOS HMSTS LF (GI LAB SUPPLIES) IMPLANT
LIGATOR 2.8MM 8.6-11.5MM SSS7_ESOPH 1 STNG MLT BAND ERG HNDL (GI LAB SUPPLIES)
MARKER ENDOS PMT ENDOMARK DIL INDIA INK 10ML STRL (GI LAB SUPPLIES) IMPLANT
MARKER ENDOS SPOT EX PERM IND DRK SYRG 5ML (GENE) IMPLANT
MARKER ENDOS SPOT EX PREFL PRE_ASSEMBLE SYRG PERM (GENE)
NEEDLE SCLRTX 25GA 2.3MM BVL STRL DISP STAR CATH INTJCT 4MM 240CM (NEEDLES & SYRINGE SUPPLIES) IMPLANT
NEEDLE SCLRTX 25GA 2.3MM BVL S_TRL DISP STAR CATH INTJCT 4MM (NEEDLES & SYRINGE SUPPLIES)
NET SPEC RETR 230CM 2.5MM RTHNT STD SHEATH 6X3CM NONST LF  DISP (Dilators) IMPLANT
NET SPEC RETR 230CM 2.5MM RTHN_T STD SHTH 6X3CM NONST LF (Dilators)
PEN SURG MRKNG INDIA INK (GI LAB SUPPLIES)
PROBE ESURG 220CM 2.3MM FIAPC FLXB STR FIRE STRL DISP (CAUTERY SUPPLIES) IMPLANT
PROBE ESURG 220CM 2.3MM FIAPC_FLXB STR FIRE ARGON PLAS COAG (CAUTERY SUPPLIES)
SNARE 230CM 2.5MM 3CM PLPK PLP_CTM OVL ENDOS 6CM STRL DISP (INT)
SNARE 230CM 2.5MM 3CM PLPK ROTR RTHNT ENDOS NONST LF  DISP (INT) IMPLANT
SNARE LRG OVL MED STF ENDOS OL_MPS DISP (INSTRUMENTS ENDOMECHANICAL)
SNARE MED OVAL 240CM 2.4MM CAPTIVATR STF ENDOS PLYP 27MM DISP (UROLOGICAL SUPPLIES) IMPLANT
SNARE MED OVL 240CM 2.4MM CAPT_IVATR STF ENDOS PLYP 27MM DISP (UROLOGICAL SUPPLIES)
SOCK CAN MEDIVAC ASCP SPECI CNVRT NONST LF (TEST) IMPLANT
TRAY GASTRIC LAV 36IN 34FR ARGYLE EDLICH MONOJECT LRG PVC 4 EYE CLS END GRAD SYRG TRNSPR 140CC NONST (TRAY) IMPLANT
TRAY GASTRIC LAV 36IN 34FR ARG_YLE EDLICH MONOJECT LRG PVC 4 (TRAY)

## 2016-09-01 NOTE — Anesthesia Preprocedure Evaluation (Addendum)
ANESTHESIA PRE-OP EVALUATION  Planned Procedure: COLONOSCOPY (N/A )  Review of Systems     anesthesia history negative     patient summary reviewed  nursing notes reviewed        Pulmonary  negative pulmonary ROS,    Cardiovascular  negative cardio ROS,   No peripheral edema,        GI/Hepatic/Renal   negative GI/hepatic/renal ROS,      Endo/Other          Neuro/Psych/MS  negative neuro/psych ROS,      Cancer  negative hematology/oncology ROS,              Physical Assessment      Patient summary reviewed and Nursing notes reviewed   Airway       Mallampati: I    TM distance: >3 FB    Neck ROM: full  Mouth Opening: good.  No Facial hair  No Beard        Dental       Dentition intact             Pulmonary    Breath sounds clear to auscultation  (-) no rhonchi, no decreased breath sounds, no wheezes, no rales and no stridor     Cardiovascular    Rhythm: regular  Rate: Normal  (-) no friction rub, carotid bruit is not present, no peripheral edema and no murmur     Other findings            Plan  Planned anesthesia type: TIVA    ASA 1     Intravenous induction     Anesthetic plan and risks discussed with patient.     Anesthesia issues/risks discussed are: PONV, Dental Injuries, Blood Loss, Stroke, Difficult Airway, Intraoperative Awareness/ Recall, Sore Throat, Aspiration and Cardiac Events/MI.        Patient's NPO status is appropriate for Anesthesia.           Plan discussed with CRNA.    (NPO since 7:30 am had water)

## 2016-09-01 NOTE — Addendum Note (Signed)
Addendum  created 44/51/46 [id number] by Trixie Rude, CRNA    Anesthesia Intra Meds edited

## 2016-09-01 NOTE — H&P (Signed)
[facility] History & Physical    Patient: Erica Harper  D.O.B.: 1960/05/20  MRN# [id number]  Date of Service: 09/01/2016   REFERRING PROVIDER: Lear Ng Woofter    HPI  Erica Harper is a 56 y.o. female who was seen today for Colonoscopy. She denies abdominal pain, blood in stool or changes in bowel habits. No family history of colon cancer.          Past Medical History:   Diagnosis Date   . Rosacea                Past Surgical History:   Procedure Laterality Date   . HX ADENOIDECTOMY     . HX CESAREAN SECTION      x2   . HX ENDOMETRIAL ABLATION     . HX HYSTERECTOMY                Current Outpatient Prescriptions   Medication Sig   . ASCORBIC ACID/MULTIVIT-MIN (EMERGEN-C ORAL) Take by mouth   . multivitamin Oral Tablet Take 1 Tab by mouth Once a day   . Sodium,Potassium,&Mag Sulfates (SUPREP BOWEL PREP KIT) 17.5-3.13-1.6 gram Oral Recon Soln Take 1 Bottle by mouth Every 12 hours          Allergies   Allergen Reactions   . Penicillins Hives/ Urticaria              Social History    Substance Use Topics    . Smoking status: Never Smoker    . Smokeless tobacco: Never Used    . Alcohol use Yes      Comment: Social          History   Drug Use No      Family Medical History     Problem Relation (Age of Onset)    Breast Cancer Maternal Grandmother (96)    Cancer Father    Diabetes Maternal Grandfather    Heart Disease Mother (22)    Hypertension Mother, Father              ROS:  General: Denies fever, chills, or any changes in weight.  EENT: Denies blurry vision, hearing loss.  Cardiovascular: Denies chest pain or palpitations.  Respiratory: Denies SOB, cough.  Gastrointestinal: Denies abdominal pain, N/V, constipation, or diarrhea. Denies any change in bowel movements or rectal bleeding.  GU: Denies dysuria, change in urinary habits, or hematuria.  Musculoskeletal: Denies weakness or joint pain. Denies trouble moving extremities.  Neurological: Denies HA, dizziness.   Hematologic: Denies hx of bleeding disorders or blood clots.  Psychiatric: Denies changes in mood, anxiety, or depression.    Objective:  BP 120/78  Ht 1.626 m (5' 4" )  Wt 97.1 kg (214 lb)  LMP 03/19/2015  BMI 36.73 kg/m2    Physical Exam:  Constitutional:  Alert, healthy, well nourished, no acute distress and not in pain.  HEENT: Normocephalic, atraumatic.   Neck: Trachea midline, supple.  Pulmonary: Normal effort and rate observed.  Cardiovascular: Regular rate and rhythm.  Abdomen: Abdomen soft and supple; No tenderness. Non-distended; No masses present. Bowel sounds normal and active in all 4 quadrants.  Extremities: No peripheral edema; No cyanosis or clubbing of nails.  Musculoskeletal: Normal muscle strength and tone of all four extremities.  Skin: No rashes or lesions present; Warm and dry. No jaundice.  Psychiatric: Normal mood and affect; Judgement and thought content normal.      Assessment:  ENCOUNTER DIAGNOSES     ICD-10-CM   1. Screening for malignant neoplasm of colon Z12.11   2. Pre-op testing Z01.818        Plan:         Orders Placed This Encounter   Procedures   . LOWER ENDOSCOPY ([facility])     Standing Status:   Future     Standing Expiration Date:   05/14/2017     Order Specific Question:   Type of process requested     Answer:   COLONOSCOPY     Order Specific Question:   Serv. Provider     Answer:   Rowe Clack     Order Specific Question:   Stop anticoagulation therapy     Answer:   Other     Order Specific Question:   Medical Clearance     Answer:   N/A     Order Specific Question:   Type of anesthesia     Answer:   MAC   . CBC/DIFF     Standing Status:   Future     Standing Expiration Date:   07/14/2017   . COMPREHENSIVE METABOLIC PANEL, NON-FASTING     Standing Status:   Future     Standing Expiration Date:   07/14/2017          Medication Orders   Medications   . Sodium,Potassium,&Mag Sulfates (SUPREP BOWEL PREP KIT) 17.5-3.13-1.6 gram Oral Recon  Soln     Sig: Take 1 Bottle by mouth Every 12 hours     Dispense:  1 Bottle     Refill:  0     Patient for colonoscopy    #1 I explained the risks of colonoscopy to the patient including bleeding, infection, missed polyps, inability to attain the cecum and need for barium enema, possibility of perforation and need for emergency surgery etc. The patient agrees to proceed.     #2 The patient will have preoperative bowel prep and laboratory studies.   Return if symptoms worsen or fail to improve.  She was given the opportunity to ask questions and those questions were appropriately answered. She agreed with the treatment plan and is encouraged to call with any additional questions or concerns.

## 2016-09-01 NOTE — Anesthesia Postprocedure Evaluation (Signed)
Anesthesia Post Op Evaluation    Patient: Erica Harper  Procedure(s):  COLONOSCOPY    Last Vitals:Temperature: 37.7 C (99.9 F) (09/01/16 1012)  Heart Rate: 78 (09/01/16 1117)  BP (Non-Invasive): 96/68 (09/01/16 1117)  Respiratory Rate: 18 (09/01/16 1117)  SpO2-1: 99 % (09/01/16 1117)  Patient is sufficiently recovered from the effects of anesthesia to participate in the evaluation and has returned to their pre-procedure level.  Patient location during evaluation: bedside   Post-procedure handoff checklist completed    Patient participation: complete - patient participated  Level of consciousness: awake and alert and responsive to verbal stimuli  Pain score: 0  Pain management: adequate  Airway patency: patent  Anesthetic complications: no  Cardiovascular status: acceptable  Respiratory status: acceptable  Hydration status: acceptable  Patient post-procedure temperature: Pt Normothermic   PONV Status: Absent

## 2016-09-01 NOTE — OR PreOp (Signed)
Pt reports finishing prep and currently running clear watery stools. Kharson Rasmusson, RN

## 2016-12-08 ENCOUNTER — Ambulatory Visit (HOSPITAL_COMMUNITY): Payer: Self-pay

## 2016-12-13 ENCOUNTER — Ambulatory Visit
Admission: RE | Admit: 2016-12-13 | Discharge: 2016-12-13 | Disposition: A | Discharge status: Home / Self Care | Payer: BC Managed Care – PPO | Source: Ambulatory Visit | Attending: Family Medicine | Admitting: Family Medicine

## 2016-12-13 ENCOUNTER — Encounter (HOSPITAL_COMMUNITY): Payer: Self-pay

## 2016-12-13 DIAGNOSIS — Z1239 Encounter for other screening for malignant neoplasm of breast: Secondary | ICD-10-CM

## 2016-12-13 DIAGNOSIS — Z1231 Encounter for screening mammogram for malignant neoplasm of breast: Secondary | ICD-10-CM | POA: Insufficient documentation

## 2016-12-15 ENCOUNTER — Telehealth (INDEPENDENT_AMBULATORY_CARE_PROVIDER_SITE_OTHER): Payer: Self-pay | Admitting: Family Medicine

## 2016-12-15 NOTE — Telephone Encounter (Signed)
-----   Message from [name], MD sent at 12/14/2016  1:33 PM EDT -----  Mammogram did not show any abnormalities.  Repeat in 1 year

## 2016-12-15 NOTE — Telephone Encounter (Signed)
Left message for pt

## 2017-03-06 ENCOUNTER — Telehealth (INDEPENDENT_AMBULATORY_CARE_PROVIDER_SITE_OTHER): Payer: Self-pay | Admitting: Family Medicine

## 2017-03-06 ENCOUNTER — Encounter (INDEPENDENT_AMBULATORY_CARE_PROVIDER_SITE_OTHER): Payer: Self-pay | Admitting: Family Medicine

## 2017-03-06 ENCOUNTER — Ambulatory Visit (INDEPENDENT_AMBULATORY_CARE_PROVIDER_SITE_OTHER): Payer: BC Managed Care – PPO | Admitting: Family Medicine

## 2017-03-06 VITALS — BP 120/80 | HR 88 | Temp 97.9°F | Resp 18 | Ht 64.0 in | Wt 177.0 lb

## 2017-03-06 DIAGNOSIS — B349 Viral infection, unspecified: Secondary | ICD-10-CM

## 2017-03-06 MED ORDER — PROMETHAZINE-DM 6.25 MG-15 MG/5 ML ORAL SYRUP: 5 mL | mL | Freq: Two times a day (BID) | ORAL | 0 refills | 0 days | Status: DC | PRN

## 2017-03-06 NOTE — Progress Notes (Signed)
[facility]  [address]  [place] [address]        Encounter Date: 03/06/2017  3:45 PM EST      Name: Erica Harper  Age: 57 y.o.  DOB: [birth year]  Sex: female    Chief Complaint:   Chief Complaint   Patient presents with   . Cough       HPI   The patient is here for a sick visit.  She developed body aches and low-grade fever approximately 2 weeks ago.  She then developed nasal congestion. The symptoms have improved.  However, she continues to have a cough which is usually dry and worse at night.  She does cough up occasional clear sputum.  Patient feels tired because she has not been able to sleep well for two weeks.  She reports that other family members have similar symptoms.    History   Family Medical History:     Problem Relation (Age of Onset)    Breast Cancer Maternal Grandmother (96)    Cancer Father    Diabetes Maternal Grandfather    Heart Disease Mother (83)    Hypertension Mother, Father        Past Medical History:   Diagnosis Date   . Rosacea      Past Surgical History:   Procedure Laterality Date   . HX ADENOIDECTOMY     . HX CESAREAN SECTION      x2   . HX ENDOMETRIAL ABLATION     . HX HYSTERECTOMY  2017    complete   . HX OOPHORECTOMY       Patient Active Problem List    Diagnosis   . Atrophic vaginitis     Current Outpatient Medications   Medication Sig   . ASCORBIC ACID/MULTIVIT-MIN (EMERGEN-C ORAL) Take by mouth Once per day as needed    . estradiol (ESTRACE) 0.01 % (0.1 mg/gram) Vaginal Cream Insert 0.5gm into vagina qhs x 2 weeks and then twice a day   . multivitamin Oral Tablet Take 1 Tab by mouth Once a day   . promethazine-dextromethorphan (PHENERGAN-DM) 6.25-15 mg/5 mL Oral Syrup Take 5 mL by mouth Twice per day as needed for Cough     Social History     Tobacco Use   Smoking Status Never Smoker   Smokeless Tobacco Never Used     Social History     Substance and Sexual Activity   Alcohol Use Yes    Comment: Social     Social History     Substance and Sexual Activity      Drug Use No       Review of Systems   Constitutional: Negative for chills, diaphoresis and fever.   HENT: Negative for trouble swallowing and voice change.    Respiratory: Negative for shortness of breath and wheezing.         No hemoptysis   Cardiovascular: Negative for chest pain and palpitations.   Gastrointestinal: Negative for blood in stool.   Endocrine: Negative for cold intolerance.   Genitourinary: Negative for dysuria and pelvic pain.   Musculoskeletal: Negative for gait problem.        Her body aches have resolved   Skin: Negative for rash.   Allergic/Immunologic: Negative for immunocompromised state.   Neurological: Negative for dizziness, weakness and headaches.   Hematological: Does not bruise/bleed easily.   Psychiatric/Behavioral: Positive for sleep disturbance.        She reports the cough  is keeping her up at night        Examination  Vitals: BP 120/80   Pulse 88   Temp 36.6 C (97.9 F)   Resp 18   Ht 1.626 m (5\' 4" )   Wt 80.3 kg (177 lb)   LMP 03/19/2015   SpO2 99%   BMI 30.38 kg/m       Physical Exam   Constitutional: She is oriented to person, place, and time. She appears well-developed. No distress.   HENT:   Head: Normocephalic and atraumatic.   Mouth/Throat: Oropharynx is clear and moist. No oropharyngeal exudate.   Eyes: Pupils are equal, round, and reactive to light. Conjunctivae and EOM are normal.   Neck: Normal range of motion.   Mild shotty tender anterior cervical lymphadenopathy   Cardiovascular: Normal rate and regular rhythm.   Pulmonary/Chest: Effort normal and breath sounds normal. No respiratory distress. She has no wheezes.   Abdominal: Soft. Bowel sounds are normal. She exhibits no distension. There is no tenderness.   Musculoskeletal: She exhibits no edema.   Normal gait   Neurological: She is alert and oriented to person, place, and time. No cranial nerve deficit.   Skin: Skin is warm and dry.   Psychiatric: She has a normal mood and affect. Her behavior is  normal.   Nursing note and vitals reviewed.      Assessment and Plan  Schwanda was seen today for cough.    Acute viral syndrome  Counseled patient on postinfectious cough.  Gave her the following cough medicine and warned her about sedation.  Follow up if cough does not continue to improve in the next week  -     promethazine-dextromethorphan (PHENERGAN-DM) 6.25-15 mg/5 mL Oral Syrup; Take 5 mL by mouth Twice per day as needed for Cough        Return if symptoms worsen or fail to improve, for follow up as scheduled.    [name], MD

## 2017-03-06 NOTE — Telephone Encounter (Signed)
Can she come in at 3:45pm today

## 2017-03-06 NOTE — Telephone Encounter (Signed)
Patient calling and she has had a cough for 2 weeks and it's bad at night and now it feels like her glands are swollen today. She feels tired a lot. She has tried mucinex but it was hurting her stomach so she quit taking it. She also took a off brand cough suppressant for about 3 days but also quit taking it.   She is wanting to know if you could see her today?             [phone]

## 2017-03-06 NOTE — Telephone Encounter (Signed)
PT WILL BE HERE AT 3:45

## 2017-03-07 ENCOUNTER — Encounter (INDEPENDENT_AMBULATORY_CARE_PROVIDER_SITE_OTHER): Payer: Self-pay | Admitting: Family Medicine

## 2017-04-14 ENCOUNTER — Telehealth (INDEPENDENT_AMBULATORY_CARE_PROVIDER_SITE_OTHER): Payer: Self-pay | Admitting: Family Medicine

## 2017-04-14 NOTE — Telephone Encounter (Signed)
Pt is here in the office and said that she hurt her back and she wanted to speak to dr.Isha about seeing a chiropractor or any recommendations. She said she wasent really doing nothing but she has been on her feet a lot.

## 2017-04-14 NOTE — Telephone Encounter (Signed)
Told Pt what Dr.Isha sent in message and voiced understanding.

## 2017-04-14 NOTE — Telephone Encounter (Signed)
She can see a chiropractor if she would like.  She can schedule a f/up if needed

## 2017-08-17 ENCOUNTER — Other Ambulatory Visit: Payer: BC Managed Care – PPO | Attending: Family Medicine

## 2017-08-17 ENCOUNTER — Ambulatory Visit (INDEPENDENT_AMBULATORY_CARE_PROVIDER_SITE_OTHER): Payer: BC Managed Care – PPO | Admitting: Family Medicine

## 2017-08-17 ENCOUNTER — Encounter (INDEPENDENT_AMBULATORY_CARE_PROVIDER_SITE_OTHER): Payer: Self-pay | Admitting: Family Medicine

## 2017-08-17 VITALS — BP 108/76 | HR 62 | Temp 98.2°F | Resp 18 | Ht 64.0 in | Wt 182.0 lb

## 2017-08-17 DIAGNOSIS — M8580 Other specified disorders of bone density and structure, unspecified site: Secondary | ICD-10-CM

## 2017-08-17 DIAGNOSIS — Z0000 Encounter for general adult medical examination without abnormal findings: Secondary | ICD-10-CM

## 2017-08-17 DIAGNOSIS — Z1239 Encounter for other screening for malignant neoplasm of breast: Secondary | ICD-10-CM

## 2017-08-17 DIAGNOSIS — E785 Hyperlipidemia, unspecified: Secondary | ICD-10-CM

## 2017-08-17 LAB — COMPREHENSIVE METABOLIC PANEL, NON-FASTING
ALBUMIN: 4 g/dL (ref 3.2–4.6)
ALKALINE PHOSPHATASE: 88 U/L (ref 20–130)
ALT (SGPT): 20 U/L (ref <=52)
ANION GAP: 6 mmol/L
AST (SGOT): 19 U/L (ref <=35)
BILIRUBIN TOTAL: 0.5 mg/dL (ref 0.3–1.2)
BUN/CREA RATIO: 15
BUN: 12 mg/dL (ref 10–25)
CALCIUM: 9.7 mg/dL (ref 8.8–10.3)
CHLORIDE: 104 mmol/L (ref 98–111)
CO2 TOTAL: 28 mmol/L (ref 21–35)
CREATININE: 0.78 mg/dL (ref <=1.30)
ESTIMATED GFR: >60 mL/min/{1.73_m2}
GLUCOSE: 94 mg/dL (ref 70–110)
POTASSIUM: 4.2 mmol/L (ref 3.5–5.0)
PROTEIN TOTAL: 7 g/dL (ref 6.0–8.3)
SODIUM: 138 mmol/L (ref 135–145)

## 2017-08-17 LAB — CBC WITH DIFF
BASOPHIL #: 0 10*3/uL (ref 0.00–0.20)
BASOPHIL %: 1 %
EOSINOPHIL #: 0.2 10*3/uL (ref 0.00–0.50)
EOSINOPHIL %: 5 %
HCT: 41.7 % (ref 34.6–46.2)
HGB: 13.6 g/dL (ref 11.8–15.8)
LYMPHOCYTE #: 2 10*3/uL (ref 0.90–3.40)
LYMPHOCYTE %: 40 %
MCH: 29.6 pg (ref 27.6–33.2)
MCHC: 32.5 g/dL — ABNORMAL LOW (ref 32.6–35.4)
MCV: 90.9 fL (ref 82.3–96.7)
MCV: 90.9 fL (ref 82.3–96.7)
MONOCYTE #: 0.4 10*3/uL (ref 0.20–0.90)
MONOCYTE %: 8 %
MPV: 9.5 fL (ref 6.6–10.2)
NEUTROPHIL #: 2.4 10*3/uL (ref 1.50–6.40)
NEUTROPHIL %: 47 %
PLATELETS: 249 10*3/uL (ref 140–440)
RBC: 4.59 10*6/uL (ref 3.80–5.24)
RDW: 14.6 % (ref 12.4–15.2)
WBC: 5.1 10*3/uL (ref 3.5–10.3)

## 2017-08-17 LAB — LIPID PANEL
CHOLESTEROL: 224 mg/dL — ABNORMAL HIGH (ref 0–199)
HDL CHOL: 97 mg/dL (ref >40)
LDL DIRECT: 120 mg/dL — ABNORMAL HIGH (ref 0–99)
TRIGLYCERIDES: 46 mg/dL (ref 0–199)
VLDL CALC: 9 mg/dL (ref 0–50)

## 2017-08-17 LAB — THYROID STIMULATING HORMONE (SENSITIVE TSH): TSH: 2.346 u[IU]/mL (ref 0.450–5.330)

## 2017-08-17 NOTE — Progress Notes (Signed)
[facility]  [address]  [place] [address]        Encounter Date: 08/17/2017 10:15 AM EDT      Name: Erica Harper  Age: 57 y.o.  DOB: [birth year]  Sex: female    Chief Complaint:   Chief Complaint   Patient presents with   . Annual Exam       HPI   Patient is here for her annual physical exam.  She is status post hysterectomy.  Patient will be due for her mammogram later this fall.  She has not had a bone density scan recently.    No new complaints    History   Family Medical History:     Problem Relation (Age of Onset)    Breast Cancer Maternal Grandmother (96)    Cancer Father    Diabetes Maternal Grandfather    Heart Disease Mother (77)    Hypertension (High Blood Pressure) Mother, Father        Past Medical History:   Diagnosis Date   . Rosacea      Past Surgical History:   Procedure Laterality Date   . HX ADENOIDECTOMY     . HX CESAREAN SECTION      x2   . HX ENDOMETRIAL ABLATION     . HX HYSTERECTOMY  2017    complete   . HX OOPHORECTOMY       Patient Active Problem List    Diagnosis   . Hyperlipidemia LDL goal <100   . Atrophic vaginitis     Current Outpatient Medications   Medication Sig   . ASCORBIC ACID/MULTIVIT-MIN (EMERGEN-C ORAL) Take by mouth Once per day as needed    . multivitamin Oral Tablet Take 1 Tab by mouth Once a day     Social History     Tobacco Use   Smoking Status Never Smoker   Smokeless Tobacco Never Used     Social History     Substance and Sexual Activity   Alcohol Use Yes    Comment: Social     Social History     Substance and Sexual Activity   Drug Use No       Review of Systems   Constitutional: Negative for chills and fever.   HENT: Negative for congestion and trouble swallowing.    Eyes: Negative for visual disturbance.   Respiratory: Negative for cough and shortness of breath.    Cardiovascular: Negative for chest pain and palpitations.   Gastrointestinal: Negative for abdominal pain, blood in stool, nausea and vomiting.   Endocrine: Negative for cold  intolerance and heat intolerance.   Genitourinary: Negative for pelvic pain and vaginal bleeding.   Musculoskeletal: Negative for arthralgias and gait problem.   Skin: Negative for pallor.   Allergic/Immunologic: Negative for immunocompromised state.   Neurological: Negative for dizziness, weakness and headaches.   Hematological: Does not bruise/bleed easily.   Psychiatric/Behavioral: Negative for dysphoric mood. The patient is not nervous/anxious.         Examination  Vitals: BP 108/76   Pulse 62   Temp 36.8 C (98.2 F)   Resp 18   Ht 1.626 m (5\' 4" )   Wt 82.6 kg (182 lb)   LMP 03/19/2015   SpO2 98%   BMI 31.24 kg/m         Physical Exam   Constitutional: She is oriented to person, place, and time. She appears well-developed. No distress.   HENT:   Head: Normocephalic  and atraumatic.   Mouth/Throat: Oropharynx is clear and moist.   Eyes: Pupils are equal, round, and reactive to light. Conjunctivae and EOM are normal.   Neck: Normal range of motion. Neck supple.   Cardiovascular: Normal rate and regular rhythm.   Pulmonary/Chest: Effort normal and breath sounds normal. No respiratory distress. She has no wheezes.   Abdominal: Soft. Bowel sounds are normal. She exhibits no distension. There is no tenderness.   Genitourinary:   Genitourinary Comments: Breast do not have any abnormal masses.  No nipple abnormality.  No axillary lymphadenopathy   Musculoskeletal: She exhibits no edema.   Normal gait.  No CVA tenderness bilaterally   Neurological: She is alert and oriented to person, place, and time. No cranial nerve deficit.   Skin: Skin is warm and dry.   Psychiatric: She has a normal mood and affect. Her behavior is normal. Judgment and thought content normal.   Speech is clear.  Good eye contact   Nursing note and vitals reviewed.      Assessment and Plan  Erica Harper was seen today for annual exam.    Physical exam, annual  BMI addressed: Advised on diet, weight loss, and exercise to reduce above normal  BMI.      Hyperlipidemia LDL goal <100  Patient is on diet therapy  -     CBC/DIFF; Future  -     COMPREHENSIVE METABOLIC PANEL, NON-FASTING; Future  -     LIPID PANEL; Future  -     THYROID STIMULATING HORMONE (SENSITIVE TSH); Future    Encounter for screening for malignant neoplasm of breast  -     MAMMO BILATERAL SCREENING-ADDL VIEWS/BREAST US AS REQ BY RAD; Future    Osteopenia, unspecified location  -     DEXA BONE DENSITOMETRY; Future    Patent had labs drawn in clinic    Return in about 1 year (around 08/18/2018) for physical.    [name], MD

## 2017-08-18 ENCOUNTER — Telehealth (INDEPENDENT_AMBULATORY_CARE_PROVIDER_SITE_OTHER): Payer: Self-pay | Admitting: Family Medicine

## 2017-08-18 NOTE — Telephone Encounter (Signed)
-----   Message from [name], MD sent at 08/17/2017  8:42 PM EDT -----  1. TSH is within normal limits  2. CBC and Chem 14 are grossly within normal limits  3. LDL is 120 and should be less than 100. She can decrease her fatty food intake to help get this less than 100.  Recheck cholesterol panel in 6 months (she can to stop by for the fasting labs)

## 2017-08-18 NOTE — Telephone Encounter (Signed)
Pt.notified

## 2017-12-14 ENCOUNTER — Other Ambulatory Visit (HOSPITAL_COMMUNITY): Payer: Self-pay | Admitting: Radiology

## 2017-12-14 ENCOUNTER — Ambulatory Visit (HOSPITAL_COMMUNITY): Payer: Self-pay | Admitting: Mammography

## 2018-08-19 ENCOUNTER — Other Ambulatory Visit: Payer: Self-pay

## 2018-08-19 ENCOUNTER — Encounter (INDEPENDENT_AMBULATORY_CARE_PROVIDER_SITE_OTHER): Payer: Self-pay | Admitting: Family Medicine

## 2018-08-19 ENCOUNTER — Ambulatory Visit (INDEPENDENT_AMBULATORY_CARE_PROVIDER_SITE_OTHER): Payer: BC Managed Care – PPO | Admitting: Family Medicine

## 2018-08-19 ENCOUNTER — Other Ambulatory Visit: Payer: BC Managed Care – PPO | Attending: Family Medicine

## 2018-08-19 VITALS — BP 122/78 | HR 82 | Temp 97.1°F | Resp 18 | Ht 64.0 in | Wt 173.0 lb

## 2018-08-19 DIAGNOSIS — Z0000 Encounter for general adult medical examination without abnormal findings: Secondary | ICD-10-CM | POA: Insufficient documentation

## 2018-08-19 DIAGNOSIS — E785 Hyperlipidemia, unspecified: Secondary | ICD-10-CM

## 2018-08-19 DIAGNOSIS — Z1239 Encounter for other screening for malignant neoplasm of breast: Secondary | ICD-10-CM

## 2018-08-19 LAB — CBC WITH DIFF
BASOPHIL #: 0 10*3/uL (ref 0.00–0.20)
BASOPHIL %: 1 %
EOSINOPHIL #: 0.2 10*3/uL (ref 0.00–0.50)
HCT: 42.6 % (ref 34.6–46.2)
HGB: 13.9 g/dL (ref 11.8–15.8)
LYMPHOCYTE #: 1.6 10*3/uL (ref 0.90–3.40)
LYMPHOCYTE %: 38 %
MCH: 29.8 pg (ref 27.6–33.2)
MCHC: 32.6 g/dL (ref 32.6–35.4)
MCV: 91.4 fL (ref 82.3–96.7)
MONOCYTE #: 0.4 10*3/uL (ref 0.20–0.90)
MONOCYTE %: 8 %
MPV: 10 fL (ref 6.6–10.2)
NEUTROPHIL #: 2.1 10*3/uL (ref 1.50–6.40)
NEUTROPHIL %: 49 %
PLATELETS: 277 10*3/uL (ref 140–440)
RBC: 4.66 10*6/uL (ref 3.80–5.24)
RDW: 14.5 % (ref 12.4–15.2)
WBC: 4.3 10*3/uL (ref 3.5–10.3)

## 2018-08-19 LAB — COMPREHENSIVE METABOLIC PANEL, NON-FASTING
ALKALINE PHOSPHATASE: 96 U/L (ref 20–130)
ALT (SGPT): 15 U/L (ref <=52)
ANION GAP: 8 mmol/L
AST (SGOT): 18 U/L (ref <=35)
BILIRUBIN TOTAL: 0.5 mg/dL (ref 0.3–1.2)
BUN: 13 mg/dL (ref 10–25)
CALCIUM: 9.5 mg/dL (ref 8.8–10.3)
CHLORIDE: 103 mmol/L (ref 98–111)
CO2 TOTAL: 27 mmol/L (ref 21–35)
CREATININE: 0.86 mg/dL (ref <=1.30)
ESTIMATED GFR: >60 mL/min/{1.73_m2}
GLUCOSE: 90 mg/dL (ref 70–110)
POTASSIUM: 4.1 mmol/L (ref 3.5–5.0)
PROTEIN TOTAL: 7.1 g/dL (ref 6.0–8.3)

## 2018-08-19 LAB — LIPID PANEL
CHOLESTEROL: 225 mg/dL — ABNORMAL HIGH (ref 0–199)
HDL CHOL: 85 mg/dL (ref >40)
LDL DIRECT: 113 mg/dL — ABNORMAL HIGH (ref 0–99)
TRIGLYCERIDES: 66 mg/dL (ref 0–199)
VLDL CALC: 13 mg/dL (ref 0–50)

## 2018-08-19 LAB — THYROID STIMULATING HORMONE (SENSITIVE TSH): TSH: 2.597 u[IU]/mL (ref 0.450–5.330)

## 2018-08-19 NOTE — Progress Notes (Signed)
[facility]  [place] [address]        Encounter Date: 08/19/2018 10:30 AM EDT      Name: Erica Harper  Age: 58 y.o.  DOB: February 22, 1960  Sex: female    Chief Complaint:   Chief Complaint   Patient presents with   . Annual Exam       HPI   Patient is here for her annual physical exam.  She has lost an additional 10 lb since her last visit 1 year ago with exercise and decreasing her carbohydrates.  Patient states she feels great.  She does not have any new complaints.    History   Family Medical History:     Problem Relation (Age of Onset)    Breast Cancer Maternal Grandmother (96)    Cancer Father    Diabetes Maternal Grandfather    Heart Disease Mother (52)    Hypertension (High Blood Pressure) Mother, Father        Past Medical History:   Diagnosis Date   . Rosacea      Past Surgical History:   Procedure Laterality Date   . HX ADENOIDECTOMY     . HX CESAREAN SECTION      x2   . HX ENDOMETRIAL ABLATION     . HX HYSTERECTOMY  2017    complete   . HX OOPHORECTOMY       Patient Active Problem List    Diagnosis   . Hyperlipidemia LDL goal <100   . Atrophic vaginitis     Current Outpatient Medications   Medication Sig   . ASCORBIC ACID/MULTIVIT-MIN (EMERGEN-C ORAL) Take by mouth Once per day as needed    . multivitamin Oral Tablet Take 1 Tab by mouth Once a day     Social History     Tobacco Use   Smoking Status Never Smoker   Smokeless Tobacco Never Used     Social History     Substance and Sexual Activity   Alcohol Use Yes    Comment: Social     Social History     Substance and Sexual Activity   Drug Use No       Review of Systems   Constitutional: Negative for chills and fever.   HENT: Negative for congestion, sneezing and trouble swallowing.    Eyes: Negative for visual disturbance.   Respiratory: Negative for cough and shortness of breath.    Cardiovascular: Negative for chest pain and palpitations.   Gastrointestinal: Negative for abdominal pain, blood in stool, nausea and vomiting.      Endocrine: Negative for cold intolerance and heat intolerance.   Genitourinary: Negative for dysuria, pelvic pain and vaginal bleeding.        Patient is status post hysterectomy for nonmalignant reasons   Musculoskeletal: Negative for arthralgias and gait problem.   Skin: Negative for rash.   Allergic/Immunologic: Negative for immunocompromised state.   Neurological: Negative for dizziness, weakness and headaches.   Hematological: Does not bruise/bleed easily.   Psychiatric/Behavioral: Negative for dysphoric mood. The patient is not nervous/anxious.         Patient feels safe at home with her husband        Examination  Vitals: BP 122/78   Pulse 82   Temp 36.2 C (97.1 F)   Resp 18   Ht 1.626 m (5\' 4" )   Wt 78.5 kg (173 lb)   LMP 03/19/2015   SpO2 100%  BMI 29.70 kg/m         Physical Exam  Vitals signs and nursing note reviewed.   Constitutional:       General: She is not in acute distress.     Appearance: She is well-developed.   HENT:      Head: Normocephalic and atraumatic.      Nose: Nose normal.      Mouth/Throat:      Mouth: Mucous membranes are moist.      Pharynx: Oropharynx is clear.   Eyes:      Extraocular Movements: Extraocular movements intact.      Conjunctiva/sclera: Conjunctivae normal.      Pupils: Pupils are equal, round, and reactive to light.   Neck:      Musculoskeletal: Normal range of motion and neck supple.   Cardiovascular:      Rate and Rhythm: Normal rate and regular rhythm.      Pulses: Normal pulses.      Comments: 2+ posterior tibial pulses bilaterally  Pulmonary:      Effort: Pulmonary effort is normal. No respiratory distress.      Breath sounds: Normal breath sounds. No wheezing.   Abdominal:      General: Bowel sounds are normal. There is no distension.      Palpations: Abdomen is soft.      Tenderness: There is no abdominal tenderness.   Genitourinary:     Comments: Breast do not have any abnormal masses.  No nipple abnormality  Musculoskeletal:      Right lower leg:  No edema.      Left lower leg: No edema.      Comments: Normal gait   Skin:     General: Skin is warm and dry.   Neurological:      Mental Status: She is alert and oriented to person, place, and time.      Cranial Nerves: No cranial nerve deficit.      Coordination: Coordination normal.   Psychiatric:         Mood and Affect: Mood normal.         Behavior: Behavior normal.         Thought Content: Thought content normal.         Assessment and Plan  Erica Harper was seen today for annual exam.    Physical exam, annual  BMI addressed: Advised on diet, weight loss, and exercise to reduce above normal BMI.      Screening for malignant neoplasm of breast  -     MAMMO BILATERAL SCREENING-ADDL VIEWS/BREAST US AS REQ BY RAD; Future    Hyperlipidemia LDL goal <100  Patient is on diet therapy.  -     CBC/DIFF; Future  -     COMPREHENSIVE METABOLIC PANEL, NON-FASTING; Future  -     LIPID PANEL; Future  -     THYROID STIMULATING HORMONE (SENSITIVE TSH); Future    She had labs in clinic    Return in about 1 year (around 08/19/2019) for physical.    [name], MD

## 2018-08-23 ENCOUNTER — Telehealth (INDEPENDENT_AMBULATORY_CARE_PROVIDER_SITE_OTHER): Payer: Self-pay | Admitting: Family Medicine

## 2018-08-23 NOTE — Telephone Encounter (Signed)
-----   Message from [name], MD sent at 08/19/2018  8:35 PM EDT -----  1.   CBC and Chem 14 are grossly within normal limits  2.  TSH is within normal limits  3.  LDL has decreased from 120 down to 113 which is better.  Continue diet therapy to help keep this below 100

## 2018-08-23 NOTE — Telephone Encounter (Signed)
Left message for pt

## 2018-08-27 LAB — HISTORICAL SURGICAL PATHOLOGY SPECIMEN

## 2018-10-15 ENCOUNTER — Telehealth (INDEPENDENT_AMBULATORY_CARE_PROVIDER_SITE_OTHER): Payer: Self-pay | Admitting: Family Medicine

## 2018-10-15 NOTE — Telephone Encounter (Signed)
Patient phoned billing stating that she had bloodwork sent to [facility] with a medical diagnosis.  She is requesting that the dx be changed to routine so that her insurance will pay in full. Will you be able to do this?

## 2018-10-15 NOTE — Telephone Encounter (Signed)
We can change the labs to be under physical

## 2018-10-19 NOTE — Telephone Encounter (Signed)
Faxed Dx code change form to [facility]

## 2018-10-22 ENCOUNTER — Ambulatory Visit (HOSPITAL_COMMUNITY): Payer: Self-pay | Admitting: Mammography

## 2018-11-01 ENCOUNTER — Other Ambulatory Visit: Payer: Self-pay

## 2018-11-01 ENCOUNTER — Encounter (HOSPITAL_COMMUNITY): Payer: Self-pay

## 2018-11-01 ENCOUNTER — Ambulatory Visit
Admission: RE | Admit: 2018-11-01 | Discharge: 2018-11-01 | Disposition: A | Discharge status: Home / Self Care | Payer: BC Managed Care – PPO | Source: Ambulatory Visit | Attending: Family Medicine | Admitting: Family Medicine

## 2018-11-01 ENCOUNTER — Telehealth (INDEPENDENT_AMBULATORY_CARE_PROVIDER_SITE_OTHER): Payer: Self-pay | Admitting: Family Medicine

## 2018-11-01 DIAGNOSIS — Z1231 Encounter for screening mammogram for malignant neoplasm of breast: Secondary | ICD-10-CM | POA: Insufficient documentation

## 2018-11-01 DIAGNOSIS — Z1239 Encounter for other screening for malignant neoplasm of breast: Secondary | ICD-10-CM

## 2018-11-01 NOTE — Telephone Encounter (Signed)
Pt notified that I faxed code change form to [facility] billing.

## 2018-11-01 NOTE — Telephone Encounter (Signed)
Pt calling & she received a bill after her yearly appt & her insurance pays for 1 a year. It was a coded incorrectly. Can she get a call back. She states she called a few weeks back & never got a return call.       [phone]

## 2018-11-02 ENCOUNTER — Telehealth (INDEPENDENT_AMBULATORY_CARE_PROVIDER_SITE_OTHER): Payer: Self-pay | Admitting: Family Medicine

## 2018-11-02 NOTE — Telephone Encounter (Signed)
-----   Message from [name], MD sent at 11/01/2018  4:44 PM EDT -----  Mammogram did not show any abnormality.  Repeat in 1 year

## 2018-11-02 NOTE — Telephone Encounter (Signed)
Pt.notified

## 2019-08-05 ENCOUNTER — Telehealth (INDEPENDENT_AMBULATORY_CARE_PROVIDER_SITE_OTHER): Payer: Self-pay | Admitting: Family Medicine

## 2019-08-05 NOTE — Telephone Encounter (Signed)
Patient notified and transferred up front to schedule.

## 2019-08-05 NOTE — Telephone Encounter (Signed)
She can come in for appt

## 2019-08-05 NOTE — Telephone Encounter (Signed)
Pt would like to get started with PT. She was exercising and hurt her joints.     [phone]

## 2019-08-08 ENCOUNTER — Ambulatory Visit (HOSPITAL_COMMUNITY)
Admission: RE | Admit: 2019-08-08 | Discharge: 2019-08-08 | Disposition: A | Discharge status: Home / Self Care | Payer: BC Managed Care – PPO | Source: Ambulatory Visit

## 2019-08-08 ENCOUNTER — Encounter (INDEPENDENT_AMBULATORY_CARE_PROVIDER_SITE_OTHER): Payer: Self-pay | Admitting: Family Medicine

## 2019-08-08 ENCOUNTER — Telehealth (INDEPENDENT_AMBULATORY_CARE_PROVIDER_SITE_OTHER): Payer: Self-pay | Admitting: Family Medicine

## 2019-08-08 ENCOUNTER — Ambulatory Visit
Admission: RE | Admit: 2019-08-08 | Discharge: 2019-08-08 | Disposition: A | Discharge status: Home / Self Care | Payer: BC Managed Care – PPO | Source: Ambulatory Visit | Attending: Family Medicine | Admitting: Family Medicine

## 2019-08-08 ENCOUNTER — Other Ambulatory Visit: Payer: Self-pay

## 2019-08-08 ENCOUNTER — Ambulatory Visit (INDEPENDENT_AMBULATORY_CARE_PROVIDER_SITE_OTHER): Payer: BC Managed Care – PPO | Admitting: Family Medicine

## 2019-08-08 VITALS — BP 110/78 | HR 94 | Temp 99.2°F | Resp 18 | Ht 64.0 in | Wt 190.0 lb

## 2019-08-08 DIAGNOSIS — M25562 Pain in left knee: Secondary | ICD-10-CM

## 2019-08-08 DIAGNOSIS — K529 Noninfective gastroenteritis and colitis, unspecified: Secondary | ICD-10-CM

## 2019-08-08 MED ORDER — IBUPROFEN 600 MG TABLET
600.00 mg | ORAL_TABLET | Freq: Three times a day (TID) | ORAL | 0 refills | Status: DC | PRN
Start: 2019-08-08 — End: 2020-09-13

## 2019-08-08 NOTE — Progress Notes (Signed)
[facility]  [address]  [place] [address]        Encounter Date: 08/08/2019 10:45 AM EDT      Name: Erica Harper  Age: 59 y.o.  DOB: 10-29-1960  Sex: female    Chief Complaint:   Chief Complaint   Patient presents with   . Knee Injury       HPI   Patient is here for pain over her left lateral knee 2 weeks ago. The pain is dull ache ago. She did fell a "pop" in her knee 2 weeks ago.  No swelling. No redness. She is able to walk w/o pain. No aggravating factors.  She had a steroid injection by a friend who is an orthopedic surgeon 2 weeks ago.  The injection did help the pain in her joint, but she continues to have pain below her knee joint.. She wants to try PT.    Patient did have nausea, vomiting, diarrhea and abdominal cramping that lasted for approximately 24 hr this past Friday and Saturday.  Other family members had similar symptoms.   She thinks it could have been gastroenteritis.  Patient feels better today and her symptoms have all resolved.    History   Family Medical History:     Problem Relation (Age of Onset)    Breast Cancer Maternal Grandmother (96)    Cancer Father    Diabetes Maternal Grandfather    Heart Disease Mother (79)    Hypertension (High Blood Pressure) Mother, Father        Past Medical History:   Diagnosis Date   . Rosacea      Past Surgical History:   Procedure Laterality Date   . HX ADENOIDECTOMY     . HX CESAREAN SECTION      x2   . HX ENDOMETRIAL ABLATION     . HX HYSTERECTOMY  2017    complete   . HX OOPHORECTOMY       Patient Active Problem List    Diagnosis   . Hyperlipidemia LDL goal <100   . Atrophic vaginitis     Current Outpatient Medications   Medication Sig   . Ibuprofen (MOTRIN) 600 mg Oral Tablet Take 1 Tablet (600 mg total) by mouth Three times a day as needed for Pain (food)   . multivitamin Oral Tablet Take 1 Tab by mouth Once a day     Social History     Tobacco Use   Smoking Status Never Smoker   Smokeless Tobacco Never Used     Social History      Substance and Sexual Activity   Alcohol Use Yes    Comment: Social     Social History     Substance and Sexual Activity   Drug Use No       Review of Systems   Constitutional: Negative for chills and fever.   HENT: Negative for trouble swallowing.    Eyes: Negative for visual disturbance.   Respiratory: Negative for cough and shortness of breath.    Cardiovascular: Negative for chest pain and palpitations.   Gastrointestinal: Negative for blood in stool.   Endocrine: Negative for cold intolerance.   Musculoskeletal: Positive for gait problem.        The pain can affect her gait   Skin: Negative for color change.   Allergic/Immunologic:        She well tolerated the COVID vaccine   Neurological: Negative for dizziness and weakness.  Hematological: Does not bruise/bleed easily.   Psychiatric/Behavioral: Positive for sleep disturbance. Negative for dysphoric mood. The patient is not nervous/anxious.         The knee pain can affect her sleep        Examination  Vitals: BP 110/78   Pulse 94   Temp 37.3 C (99.2 F)   Resp 18   Ht 1.626 m (5\' 4" )   Wt 86.2 kg (190 lb)   LMP 03/19/2015   SpO2 97%   BMI 32.61 kg/m         Physical Exam  Vitals and nursing note reviewed.   Constitutional:       General: She is not in acute distress.     Appearance: Normal appearance. She is well-developed.   HENT:      Head: Normocephalic and atraumatic.      Nose: Nose normal.      Mouth/Throat:      Mouth: Mucous membranes are moist.      Pharynx: Oropharynx is clear.   Eyes:      Extraocular Movements: Extraocular movements intact.      Conjunctiva/sclera: Conjunctivae normal.      Pupils: Pupils are equal, round, and reactive to light.   Cardiovascular:      Rate and Rhythm: Normal rate and regular rhythm.      Pulses: Normal pulses.      Comments: 2+ posterior tibial pulses bilaterally  Pulmonary:      Effort: Pulmonary effort is normal. No respiratory distress.      Breath sounds: Normal breath sounds. No wheezing.    Abdominal:      General: Bowel sounds are normal. There is no distension.      Palpations: Abdomen is soft.      Tenderness: There is no abdominal tenderness.   Musculoskeletal:         General: Tenderness present.      Cervical back: Normal range of motion and neck supple.      Right lower leg: No edema.      Left lower leg: No edema.      Comments: Patient has tenderness over her left tibial tuberosity upon palpation.  She also has crepitus upon extension.  She has mild edema below her lower patella.  No erythema.   Antalgic gait   Skin:     General: Skin is warm and dry.   Neurological:      Mental Status: She is alert and oriented to person, place, and time.      Cranial Nerves: No cranial nerve deficit.      Sensory: No sensory deficit.      Coordination: Coordination normal.      Comments: Normal sensation over her left knee   Psychiatric:         Mood and Affect: Mood normal.         Behavior: Behavior normal.         Thought Content: Thought content normal.         Assessment and Plan  Aloha was seen today for knee injury.    Left knee pain  Patient was given a prescription for physical therapy.  She well to get an x-ray of her knee.  She was given a prescription for ibuprofen and was reminded to take it with food to avoid stomach upset.  If her symptoms do not improve, she will call back for further evaluation including a possible MRI  -  XR KNEE LEFT 4 OR MORE VIEWS; Future  -     Ibuprofen (MOTRIN) 600 mg Oral Tablet; Take 1 Tablet (600 mg total) by mouth Three times a day as needed for Pain (food)    Gastroenteritis  Her symptoms have resolved.  She will let me know if the symptoms reoccur      Return for follow up as scheduled.    [name], MD

## 2019-08-08 NOTE — Telephone Encounter (Signed)
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  She can try physical therapy and if that does not help, she can let me know for further evaluation

## 2019-08-08 NOTE — Telephone Encounter (Signed)
Pt.notified

## 2019-08-19 ENCOUNTER — Telehealth (INDEPENDENT_AMBULATORY_CARE_PROVIDER_SITE_OTHER): Payer: Self-pay | Admitting: Family Medicine

## 2019-08-19 DIAGNOSIS — Z1231 Encounter for screening mammogram for malignant neoplasm of breast: Secondary | ICD-10-CM

## 2019-08-19 NOTE — Telephone Encounter (Signed)
Please remind her to schedule her annual exam

## 2019-08-19 NOTE — Telephone Encounter (Signed)
Her annual appointment on 08-23-19 was cancelled.

## 2019-08-19 NOTE — Telephone Encounter (Signed)
Mammogram scheduled for 11-14-19 at 3:15pm. Pt notified.

## 2019-08-19 NOTE — Telephone Encounter (Signed)
Pt is wanting to know if you will schedule her a mammogram at [place]

## 2019-08-23 ENCOUNTER — Encounter (INDEPENDENT_AMBULATORY_CARE_PROVIDER_SITE_OTHER): Payer: Self-pay | Admitting: Family Medicine

## 2019-11-14 ENCOUNTER — Telehealth (INDEPENDENT_AMBULATORY_CARE_PROVIDER_SITE_OTHER): Payer: Self-pay | Admitting: Family Medicine

## 2019-11-14 ENCOUNTER — Encounter (HOSPITAL_COMMUNITY): Payer: Self-pay

## 2019-11-14 ENCOUNTER — Other Ambulatory Visit: Payer: Self-pay

## 2019-11-14 ENCOUNTER — Ambulatory Visit
Admission: RE | Admit: 2019-11-14 | Discharge: 2019-11-14 | Disposition: A | Discharge status: Home / Self Care | Payer: BC Managed Care – PPO | Source: Ambulatory Visit | Attending: Family Medicine | Admitting: Family Medicine

## 2019-11-14 DIAGNOSIS — Z1231 Encounter for screening mammogram for malignant neoplasm of breast: Secondary | ICD-10-CM | POA: Insufficient documentation

## 2019-11-14 NOTE — Telephone Encounter (Signed)
-----   Message from [name], MD sent at 11/14/2019  4:13 PM EDT -----  Mammogram is negative.   I did order this test as a courtesy to her because she was scheduled for her annual exam.  She canceled the annual exam and can reschedule that at her convenience.   She will also need fasting labs.

## 2019-11-14 NOTE — Telephone Encounter (Signed)
Left message for pt

## 2019-11-17 ENCOUNTER — Encounter

## 2019-12-06 ENCOUNTER — Telehealth (INDEPENDENT_AMBULATORY_CARE_PROVIDER_SITE_OTHER): Payer: Self-pay | Admitting: Family Medicine

## 2019-12-06 NOTE — Telephone Encounter (Signed)
Patient can schedule a f/up.

## 2019-12-06 NOTE — Telephone Encounter (Signed)
Pt would like for Dr. Lear Ng to refer to an ORTHOPEDIC for her knees....    [phone]

## 2019-12-07 NOTE — Telephone Encounter (Signed)
Left detailed message for patient.

## 2019-12-22 ENCOUNTER — Telehealth (INDEPENDENT_AMBULATORY_CARE_PROVIDER_SITE_OTHER): Payer: Self-pay | Admitting: Family Medicine

## 2019-12-22 NOTE — Telephone Encounter (Signed)
Her physical therapist, Mr. Yaakov Guthrie, texted me earlier this week and was concerned about a vascular issue with her right leg.  He was advised to ask the patient to follow-up.  She can be  seen sooner than December 7th.  I can add her in next Wednesday afternoon if needed.

## 2019-12-23 NOTE — Telephone Encounter (Signed)
Pt scheduled for November 10th.

## 2019-12-28 ENCOUNTER — Other Ambulatory Visit: Payer: Self-pay

## 2019-12-28 ENCOUNTER — Ambulatory Visit (INDEPENDENT_AMBULATORY_CARE_PROVIDER_SITE_OTHER): Payer: BC Managed Care – PPO | Admitting: Family Medicine

## 2019-12-28 ENCOUNTER — Encounter (INDEPENDENT_AMBULATORY_CARE_PROVIDER_SITE_OTHER): Payer: Self-pay | Admitting: Family Medicine

## 2019-12-28 VITALS — BP 126/88 | HR 76 | Temp 98.0°F | Resp 18 | Ht 64.0 in | Wt 198.0 lb

## 2019-12-28 DIAGNOSIS — M1711 Unilateral primary osteoarthritis, right knee: Secondary | ICD-10-CM

## 2019-12-28 DIAGNOSIS — Z0000 Encounter for general adult medical examination without abnormal findings: Secondary | ICD-10-CM

## 2019-12-28 DIAGNOSIS — M2180 Other specified acquired deformities of unspecified limb: Secondary | ICD-10-CM

## 2019-12-28 NOTE — Progress Notes (Signed)
[facility]  [place] [id number]        Encounter Date: 12/28/2019  1:30 PM EST      Name: Erica Harper  Age: 59 y.o.  DOB: 18-Aug-1960  Sex: female    Chief Complaint:   Chief Complaint   Patient presents with   . Leg Pain   . Annual Physical       HPI   Patient is here for her annual physical exam.      Patient was having pain in her left knee in May.  She did have a steroid injection in her knee at that time which helped her pain.  She then developed sharp pain over her right lower knee in September.  Patient has been doing physical therapy which has helped her knee pain.    The physical therapy was concerned about the increased circumference of her right thigh recently. Patient states she has had increased circumference of her right thigh since over 2 years.  No abnormal coldness in her right leg.  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   Patient does not have pain in her right leg muscles upon exertion.  No discoloration of her right leg    Her current pain is over the lower part of the right knee which is a dull ache.     She has not had an x-ray of her right knee in the past.      History   Family Medical History:     Problem Relation (Age of Onset)    Breast Cancer Maternal Grandmother (96)    Cancer Father    Diabetes Maternal Grandfather    Heart Disease Mother (39)    Hypertension (High Blood Pressure) Mother, Father        Past Medical History:   Diagnosis Date   . Rosacea      Past Surgical History:   Procedure Laterality Date   . HX ADENOIDECTOMY     . HX CESAREAN SECTION      x2   . HX ENDOMETRIAL ABLATION     . HX HYSTERECTOMY  2017    complete   . HX OOPHORECTOMY       Patient Active Problem List    Diagnosis   . Hyperlipidemia LDL goal <100   . Atrophic vaginitis     Current Outpatient Medications   Medication Sig   . Ibuprofen (MOTRIN) 600 mg Oral Tablet Take 1 Tablet (600 mg total) by mouth Three times a day as needed for Pain (food)   . multivitamin Oral  Tablet Take 1 Tab by mouth Once a day     Social History     Tobacco Use   Smoking Status Never Smoker   Smokeless Tobacco Never Used     Social History     Substance and Sexual Activity   Alcohol Use Yes    Comment: Social     Social History     Substance and Sexual Activity   Drug Use No       Review of Systems   Constitutional: Negative for chills and fever.   HENT: Negative for congestion and sneezing.    Eyes: Negative for visual disturbance.   Respiratory: Negative for cough and shortness of breath.    Cardiovascular: Negative for chest pain and palpitations.   Gastrointestinal: Negative for abdominal pain, blood in stool and nausea.   Endocrine: Negative for cold intolerance and heat  intolerance.   Genitourinary: Negative for pelvic pain and vaginal bleeding.        Patient is status post hysterectomy   Musculoskeletal: Negative for gait problem and myalgias.        No muscle aches in her right leg   Skin: Negative for pallor.   Allergic/Immunologic:        She does not want the flu vaccine   Neurological: Negative for dizziness, weakness and numbness.        No numbness/ tingling in her legs   Hematological: Does not bruise/bleed easily.   Psychiatric/Behavioral: Negative for dysphoric mood. The patient is not nervous/anxious.         She feels safe at home with her husband        Examination  Vitals: BP 126/88   Pulse 76   Temp 36.7 C (98 F)   Resp 18   Ht 1.626 m (5\' 4" )   Wt 89.8 kg (198 lb)   LMP 03/19/2015   SpO2 98%   BMI 33.99 kg/m         Physical Exam  Vitals and nursing note reviewed.   Constitutional:       General: She is not in acute distress.     Appearance: Normal appearance. She is well-developed.   HENT:      Head: Normocephalic and atraumatic.      Nose: Nose normal.      Mouth/Throat:      Mouth: Mucous membranes are moist.      Pharynx: Oropharynx is clear.   Eyes:      Extraocular Movements: Extraocular movements intact.      Conjunctiva/sclera: Conjunctivae normal.       Pupils: Pupils are equal, round, and reactive to light.   Cardiovascular:      Rate and Rhythm: Normal rate and regular rhythm.      Pulses: Normal pulses.      Comments: 2+ posterior tibial pulses bilaterally  Pulmonary:      Effort: Pulmonary effort is normal. No respiratory distress.      Breath sounds: Normal breath sounds. No wheezing.   Abdominal:      General: Bowel sounds are normal. There is no distension.      Palpations: Abdomen is soft.      Tenderness: There is no abdominal tenderness.   Musculoskeletal:      Cervical back: Normal range of motion and neck supple.      Right lower leg: No edema.      Left lower leg: No edema.      Comments: 25.5 inch circumference in right upper mid thigh and 23.5inch  circumference in left upper mid thigh . 17.5 inch circumference in right mid calf. 17 inch circumference in her left mid calf.    Patient complains of mild tenderness over her right medial knee joint and over her right lower knee joint upon palpation.  Mild crepitus upon palpation.  No joint effusion.   Normal gait   Skin:     General: Skin is warm and dry.      Findings: No erythema.      Comments: No joint erythema over her knees.  She does have varicose veins over her right anterior thigh   Neurological:      Mental Status: She is alert and oriented to person, place, and time.      Cranial Nerves: No cranial nerve deficit.      Sensory: No sensory deficit.  Comments: Normal sensation over her right leg   Psychiatric:         Mood and Affect: Mood normal.         Behavior: Behavior normal.         Thought Content: Thought content normal.         Judgment: Judgment normal.      Comments: Speech is clear.  Good eye contact.  Attention span and concentration appear to be normal         Assessment and Plan  Erica Harper was seen today for leg pain and annual physical.    Physical exam  She will return for fasting labs.   Reviewed her last colonoscopy and mammogram from 2 months ago with her.  BMI addressed:  Advised on diet, weight loss, and exercise to reduce above normal BMI.    -     CBC/DIFF; Future  -     COMPREHENSIVE METABOLIC PANEL, NON-FASTING; Future  -     LIPID PANEL; Future  -     THYROID STIMULATING HORMONE (SENSITIVE TSH); Future    Primary osteoarthritis of right knee  She will get x-ray of her knee.  She prefers to continue physical therapy at this time because her knee pain is improving.  She will call back if she wants a referral to orthopedic surgery  -     XR KNEE RIGHT 3 VIEW; Future    Leg circumference inequality  Refer to vascular surgery if to check her circulation.  -     Refer to [facility]; Future        Return in about 1 year (around 12/27/2020) for physical.    [name], MD

## 2019-12-29 ENCOUNTER — Ambulatory Visit (INDEPENDENT_AMBULATORY_CARE_PROVIDER_SITE_OTHER): Payer: BC Managed Care – PPO

## 2019-12-29 ENCOUNTER — Telehealth (INDEPENDENT_AMBULATORY_CARE_PROVIDER_SITE_OTHER): Payer: Self-pay | Admitting: Family Medicine

## 2019-12-29 ENCOUNTER — Ambulatory Visit
Admission: RE | Admit: 2019-12-29 | Discharge: 2019-12-29 | Disposition: A | Discharge status: Home / Self Care | Payer: BC Managed Care – PPO | Source: Ambulatory Visit | Attending: Family Medicine | Admitting: Family Medicine

## 2019-12-29 ENCOUNTER — Other Ambulatory Visit (HOSPITAL_COMMUNITY): Payer: BC Managed Care – PPO | Admitting: Family Medicine

## 2019-12-29 DIAGNOSIS — Z0000 Encounter for general adult medical examination without abnormal findings: Secondary | ICD-10-CM

## 2019-12-29 DIAGNOSIS — M1711 Unilateral primary osteoarthritis, right knee: Secondary | ICD-10-CM | POA: Insufficient documentation

## 2019-12-29 LAB — LIPID PANEL
CHOLESTEROL: 233 mg/dL — ABNORMAL HIGH (ref 0–199)
HDL CHOL: 90 mg/dL (ref >40)
LDL DIRECT: 136 mg/dL — ABNORMAL HIGH (ref 0–99)
TRIGLYCERIDES: 61 mg/dL (ref 0–199)
VLDL CALC: 12 mg/dL (ref 0–50)

## 2019-12-29 LAB — COMPREHENSIVE METABOLIC PANEL, NON-FASTING
ALBUMIN: 4.2 g/dL (ref 3.2–4.6)
ALKALINE PHOSPHATASE: 78 U/L (ref 20–130)
ALT (SGPT): 17 U/L (ref <=52)
ANION GAP: 8 mmol/L
AST (SGOT): 18 U/L (ref <=35)
BILIRUBIN TOTAL: 0.5 mg/dL (ref 0.3–1.2)
BUN/CREA RATIO: 13
BUN: 11 mg/dL (ref 10–25)
CALCIUM: 9.4 mg/dL (ref 8.8–10.3)
CHLORIDE: 103 mmol/L (ref 98–111)
CO2 TOTAL: 27 mmol/L (ref 21–35)
CREATININE: 0.83 mg/dL (ref <=1.30)
ESTIMATED GFR: >60 mL/min/{1.73_m2}
GLUCOSE: 88 mg/dL (ref 70–110)
POTASSIUM: 4.4 mmol/L (ref 3.5–5.0)
PROTEIN TOTAL: 7.4 g/dL (ref 6.0–8.3)
SODIUM: 138 mmol/L (ref 135–145)

## 2019-12-29 LAB — CBC WITH DIFF
BASOPHIL #: 0 10*3/uL (ref 0.00–0.20)
BASOPHIL %: 1 %
EOSINOPHIL #: 0.2 10*3/uL (ref 0.00–0.50)
EOSINOPHIL %: 4 %
HCT: 40.5 % (ref 34.6–46.2)
HGB: 13.4 g/dL (ref 11.8–15.8)
LYMPHOCYTE #: 1.6 10*3/uL (ref 0.90–3.40)
LYMPHOCYTE %: 32 %
MCH: 29.4 pg (ref 27.6–33.2)
MCHC: 33.1 g/dL (ref 32.6–35.4)
MCV: 88.7 fL (ref 82.3–96.7)
MONOCYTE #: 0.4 10*3/uL (ref 0.20–0.90)
MONOCYTE %: 8 %
MPV: 9 fL (ref 6.6–10.2)
NEUTROPHIL #: 2.8 10*3/uL (ref 1.50–6.40)
NEUTROPHIL %: 56 %
PLATELETS: 336 10*3/uL (ref 140–440)
RBC: 4.56 10*6/uL (ref 3.80–5.24)
RDW: 14.2 % (ref 12.4–15.2)
WBC: 5 10*3/uL (ref 3.5–10.3)

## 2019-12-29 LAB — THYROID STIMULATING HORMONE (SENSITIVE TSH): TSH: 2.205 u[IU]/mL (ref 0.450–5.330)

## 2019-12-29 NOTE — Telephone Encounter (Signed)
-----   Message from [name], MD sent at 12/29/2019  2:40 PM EST -----  Patient does have mild degenerative osteoarthritis.  She can continue physical therapy and let me know if her symptoms worsen

## 2019-12-29 NOTE — Telephone Encounter (Signed)
Pt.notified

## 2019-12-30 ENCOUNTER — Other Ambulatory Visit (INDEPENDENT_AMBULATORY_CARE_PROVIDER_SITE_OTHER): Payer: Self-pay | Admitting: Family Medicine

## 2019-12-30 ENCOUNTER — Encounter (INDEPENDENT_AMBULATORY_CARE_PROVIDER_SITE_OTHER): Payer: Self-pay | Admitting: Vascular & Interventional Radiology

## 2019-12-30 ENCOUNTER — Telehealth (INDEPENDENT_AMBULATORY_CARE_PROVIDER_SITE_OTHER): Payer: Self-pay | Admitting: Family Medicine

## 2019-12-30 MED ORDER — ATORVASTATIN 20 MG TABLET
20.00 mg | ORAL_TABLET | Freq: Every day | ORAL | 1 refills | Status: AC
Start: 2019-12-30 — End: ?

## 2019-12-30 NOTE — Telephone Encounter (Signed)
Pt notified of lab results and will try the Lipitor.

## 2019-12-30 NOTE — Telephone Encounter (Signed)
Pt was returning your call         [phone]

## 2019-12-30 NOTE — Telephone Encounter (Signed)
Left message for pt to return call.

## 2019-12-30 NOTE — Telephone Encounter (Signed)
-----   Message from [name], MD sent at 12/29/2019  6:22 PM EST -----  1.  CBC and Chem 14 are grossly within normal limits  2.  LDL has continued to increased over the past 3 years and is 136.  The goal is to get this below 100.  I recommend she try Lipitor 20 mg 1 every evening.  Recheck cholesterol panel, Chem 14, CPK in 6 weeks  3.  TSH is within normal limits

## 2019-12-30 NOTE — Telephone Encounter (Signed)
Pt.notified

## 2020-01-24 ENCOUNTER — Encounter (INDEPENDENT_AMBULATORY_CARE_PROVIDER_SITE_OTHER): Payer: Self-pay | Admitting: Family Medicine

## 2020-02-04 ENCOUNTER — Telehealth (HOSPITAL_COMMUNITY): Payer: Self-pay | Admitting: Internal Medicine

## 2020-02-04 DIAGNOSIS — Z20822 Contact with and (suspected) exposure to covid-19: Secondary | ICD-10-CM

## 2020-02-05 ENCOUNTER — Ambulatory Visit: Payer: BC Managed Care – PPO | Attending: Internal Medicine

## 2020-02-05 DIAGNOSIS — Z20822 Contact with and (suspected) exposure to covid-19: Secondary | ICD-10-CM | POA: Insufficient documentation

## 2020-02-05 LAB — COVID-19 ~~LOC~~ MOLECULAR LAB TESTING: SARS-COV-2: NOT DETECTED

## 2020-02-06 ENCOUNTER — Telehealth (INDEPENDENT_AMBULATORY_CARE_PROVIDER_SITE_OTHER): Payer: Self-pay | Admitting: Family Medicine

## 2020-02-06 NOTE — Telephone Encounter (Signed)
Left message for pt

## 2020-02-06 NOTE — Telephone Encounter (Signed)
-----   Message from [name], MD sent at 02/06/2020  8:28 AM EST -----  COVID test was negative

## 2020-02-22 ENCOUNTER — Other Ambulatory Visit (INDEPENDENT_AMBULATORY_CARE_PROVIDER_SITE_OTHER): Payer: Self-pay | Admitting: Family Medicine

## 2020-03-19 ENCOUNTER — Other Ambulatory Visit (INDEPENDENT_AMBULATORY_CARE_PROVIDER_SITE_OTHER): Payer: Self-pay | Admitting: Family Medicine

## 2020-04-14 ENCOUNTER — Other Ambulatory Visit (HOSPITAL_COMMUNITY): Payer: Self-pay | Admitting: Family Medicine

## 2020-04-14 DIAGNOSIS — Z139 Encounter for screening, unspecified: Secondary | ICD-10-CM

## 2020-04-14 LAB — CBC WITH DIFF
BASOPHIL #: <0.1 10*3/uL (ref <=0.20)
BASOPHIL %: 1 %
EOSINOPHIL #: 0.15 10*3/uL (ref <=0.50)
EOSINOPHIL %: 3 %
HCT: 45.5 % (ref 34.8–46.0)
HGB: 14 g/dL (ref 11.5–16.0)
IMMATURE GRANULOCYTE #: <0.1 10*3/uL (ref <0.10)
IMMATURE GRANULOCYTE %: 0 % (ref 0–1)
LYMPHOCYTE #: 1.45 10*3/uL (ref 1.00–4.80)
LYMPHOCYTE %: 28 %
MCH: 28.6 pg (ref 26.0–32.0)
MCHC: 30.8 g/dL — ABNORMAL LOW (ref 31.0–35.5)
MCV: 92.9 fL (ref 78.0–100.0)
MONOCYTE #: 0.39 10*3/uL (ref 0.20–1.10)
MONOCYTE %: 7 %
MPV: 10.9 fL (ref 8.7–12.5)
NEUTROPHIL #: 3.22 10*3/uL (ref 1.50–7.70)
NEUTROPHIL %: 61 %
PLATELETS: 285 10*3/uL (ref 150–400)
RBC: 4.9 10*6/uL (ref 3.85–5.22)
RDW-CV: 14 % (ref 11.5–15.5)
WBC: 5.3 10*3/uL (ref 3.7–11.0)

## 2020-04-14 LAB — COMPREHENSIVE METABOLIC PNL, FASTING
ALBUMIN: 4.5 g/dL (ref 3.2–4.6)
ALKALINE PHOSPHATASE: 83 U/L (ref 20–130)
ALT (SGPT): 26 U/L (ref <=52)
ANION GAP: 7 mmol/L
AST (SGOT): 20 U/L (ref <=35)
BILIRUBIN TOTAL: 0.6 mg/dL (ref 0.3–1.2)
BUN/CREA RATIO: 17
BUN: 16 mg/dL (ref 10–25)
CALCIUM: 9.7 mg/dL (ref 8.8–10.3)
CHLORIDE: 105 mmol/L (ref 98–111)
CO2 TOTAL: 28 mmol/L (ref 21–35)
CREATININE: 0.94 mg/dL (ref <=1.30)
ESTIMATED GFR: >60 mL/min/{1.73_m2}
GLUCOSE: 99 mg/dL (ref 70–110)
POTASSIUM: 4.7 mmol/L (ref 3.5–5.0)
PROTEIN TOTAL: 7.4 g/dL (ref 6.0–8.3)
SODIUM: 140 mmol/L (ref 135–145)

## 2020-04-14 LAB — LDH: LDH: 181 U/L (ref <=250)

## 2020-04-14 LAB — MAGNESIUM: MAGNESIUM: 2.1 mg/dL (ref 1.8–2.3)

## 2020-04-14 LAB — URIC ACID: URIC ACID: 5.3 mg/dL (ref 1.5–6.5)

## 2020-04-14 LAB — LIPID PANEL
CHOLESTEROL: 193 mg/dL (ref 0–199)
HDL CHOL: 96 mg/dL (ref >40)
LDL DIRECT: 83 mg/dL (ref 0–99)
TRIGLYCERIDES: 55 mg/dL (ref 0–199)
VLDL CALC: 11 mg/dL (ref 0–50)

## 2020-04-14 LAB — PHOSPHORUS: PHOSPHORUS: 4 mg/dL (ref 2.5–4.5)

## 2020-04-16 ENCOUNTER — Telehealth (INDEPENDENT_AMBULATORY_CARE_PROVIDER_SITE_OTHER): Payer: Self-pay | Admitting: Family Medicine

## 2020-04-16 NOTE — Telephone Encounter (Signed)
-----   Message from [name], MD sent at 04/15/2020 12:46 PM EST -----  1. CBC and Chem 14 are grossly within normal limits  2. LDL has decreased from 136 down to 83 which is excellent.  Continue the Lipitor  3. Magnesium, phosphorus, and uric acid levels are within normal limits

## 2020-04-16 NOTE — Telephone Encounter (Signed)
Pt.notified

## 2020-04-17 ENCOUNTER — Other Ambulatory Visit (INDEPENDENT_AMBULATORY_CARE_PROVIDER_SITE_OTHER): Payer: Self-pay | Admitting: Family Medicine

## 2020-04-30 ENCOUNTER — Encounter (HOSPITAL_COMMUNITY): Payer: Self-pay

## 2020-04-30 ENCOUNTER — Emergency Department (HOSPITAL_COMMUNITY): Payer: BC Managed Care – PPO | Admitting: Radiology

## 2020-04-30 ENCOUNTER — Other Ambulatory Visit: Payer: Self-pay

## 2020-04-30 ENCOUNTER — Emergency Department (HOSPITAL_COMMUNITY): Payer: BC Managed Care – PPO

## 2020-04-30 ENCOUNTER — Emergency Department
Admission: EM | Admit: 2020-04-30 | Discharge: 2020-04-30 | Disposition: A | Discharge status: Home / Self Care | Payer: BC Managed Care – PPO | Attending: Emergency Medicine | Admitting: Emergency Medicine

## 2020-04-30 ENCOUNTER — Encounter (INDEPENDENT_AMBULATORY_CARE_PROVIDER_SITE_OTHER): Payer: Self-pay | Admitting: Family Medicine

## 2020-04-30 ENCOUNTER — Ambulatory Visit (INDEPENDENT_AMBULATORY_CARE_PROVIDER_SITE_OTHER): Payer: BC Managed Care – PPO | Admitting: Family Medicine

## 2020-04-30 ENCOUNTER — Telehealth (INDEPENDENT_AMBULATORY_CARE_PROVIDER_SITE_OTHER): Payer: Self-pay | Admitting: Family Medicine

## 2020-04-30 VITALS — BP 134/86 | HR 92 | Temp 98.8°F | Resp 18 | Ht 64.0 in | Wt 196.0 lb

## 2020-04-30 DIAGNOSIS — R600 Localized edema: Secondary | ICD-10-CM

## 2020-04-30 DIAGNOSIS — E785 Hyperlipidemia, unspecified: Secondary | ICD-10-CM | POA: Insufficient documentation

## 2020-04-30 DIAGNOSIS — R609 Edema, unspecified: Secondary | ICD-10-CM

## 2020-04-30 DIAGNOSIS — R079 Chest pain, unspecified: Secondary | ICD-10-CM

## 2020-04-30 DIAGNOSIS — K769 Liver disease, unspecified: Secondary | ICD-10-CM

## 2020-04-30 DIAGNOSIS — M2170 Unequal limb length (acquired), unspecified site: Secondary | ICD-10-CM

## 2020-04-30 LAB — BASIC METABOLIC PANEL
ANION GAP: 9 mmol/L
BUN/CREA RATIO: 10
BUN: 8 mg/dL — ABNORMAL LOW (ref 10–25)
CALCIUM: 10.3 mg/dL (ref 8.8–10.3)
CHLORIDE: 104 mmol/L (ref 98–111)
CO2 TOTAL: 27 mmol/L (ref 21–35)
CREATININE: 0.83 mg/dL (ref <=1.30)
ESTIMATED GFR: >60 mL/min/{1.73_m2}
GLUCOSE: 97 mg/dL (ref 70–110)
POTASSIUM: 3.9 mmol/L (ref 3.5–5.0)
SODIUM: 140 mmol/L (ref 135–145)

## 2020-04-30 LAB — TROPONIN-I: TROPONIN I: <0.03 ng/mL (ref <=0.04)

## 2020-04-30 LAB — CBC WITH DIFF
BASOPHIL #: <0.1 10*3/uL (ref <=0.20)
BASOPHIL %: 1 %
EOSINOPHIL #: <0.1 10*3/uL (ref <=0.50)
EOSINOPHIL %: 1 %
HCT: 45.5 % (ref 34.8–46.0)
HGB: 14.3 g/dL (ref 11.5–16.0)
IMMATURE GRANULOCYTE #: <0.1 10*3/uL (ref <0.10)
IMMATURE GRANULOCYTE %: 0 % (ref 0–1)
LYMPHOCYTE #: 1.59 10*3/uL (ref 1.00–4.80)
LYMPHOCYTE %: 24 %
MCH: 28.4 pg (ref 26.0–32.0)
MCHC: 31.4 g/dL (ref 31.0–35.5)
MCV: 90.5 fL (ref 78.0–100.0)
MONOCYTE #: 0.38 10*3/uL (ref 0.20–1.10)
MONOCYTE %: 6 %
MPV: 10.4 fL (ref 8.7–12.5)
NEUTROPHIL #: 4.53 10*3/uL (ref 1.50–7.70)
NEUTROPHIL %: 68 %
PLATELETS: 319 10*3/uL (ref 150–400)
RBC: 5.03 10*6/uL (ref 3.85–5.22)
RDW-CV: 14.2 % (ref 11.5–15.5)
WBC: 6.6 10*3/uL (ref 3.7–11.0)

## 2020-04-30 LAB — D-DIMER: D-DIMER: 353 ng/mL DDU — ABNORMAL HIGH (ref 200–229)

## 2020-04-30 MED ORDER — IOVERSOL 320 MG IODINE/ML INTRAVENOUS SOLUTION
95.0000 mL | INTRAVENOUS | Status: AC
Start: 2020-04-30 — End: 2020-04-30
  Administered 2020-04-30 (×2): 95 mL via INTRAVENOUS

## 2020-04-30 MED ORDER — SODIUM CHLORIDE 0.9 % INJECTION SOLUTION
10.0000 mL | INTRAMUSCULAR | Status: DC
Start: 2020-04-30 — End: 2020-04-30

## 2020-04-30 NOTE — Progress Notes (Signed)
[facility]  [place] [id number]        Encounter Date: 04/30/2020 11:15 AM EDT      Name: Erica Harper  Age: 60 y.o.  DOB: [birth year]  Sex: female    Chief Complaint:   Chief Complaint   Patient presents with    Chest Tightness       HPI   Patient is here for "thumping" chest pain that started yesterday. She has noticed occasional fluttering. She also has chest pressure in her mid upper chest that can radiate to her mid back. The symptoms are still occurring.  She has not had cardiovascular events in the past... She took a Nurse, adult today and it did not help her symptoms.  No SOB.  No diaphoresis.  She has been on Lipitor since November.      Patient has not been having increased anxiety.     She does have a chronic history of increased right leg swelling.  Patient has been referred to vascular surgery for further evaluation, but her appointment is not until the end of the month.   She has noticed pain behind her right knee that can come and go.    History   Family Medical History:     Problem Relation (Age of Onset)    Breast Cancer Maternal Grandmother (96)    Cancer Father    Diabetes Maternal Grandfather    Heart Disease Mother (62)    Hypertension (High Blood Pressure) Mother, Father        Past Medical History:   Diagnosis Date    Rosacea      Past Surgical History:   Procedure Laterality Date    HX ADENOIDECTOMY      HX CESAREAN SECTION      x2    HX ENDOMETRIAL ABLATION      HX HYSTERECTOMY  2017    complete    HX OOPHORECTOMY       Patient Active Problem List    Diagnosis    Hyperlipidemia LDL goal <100    Atrophic vaginitis     Current Outpatient Medications   Medication Sig    atorvastatin (LIPITOR) 20 mg Oral Tablet Take 1 Tablet (20 mg total) by mouth Every evening    Ibuprofen (MOTRIN) 600 mg Oral Tablet Take 1 Tablet (600 mg total) by mouth Three times a day as needed for Pain (food)    multivitamin Oral Tablet Take 1 Tab by mouth Once a day     Social History      Tobacco Use   Smoking Status Never Smoker   Smokeless Tobacco Never Used     Social History     Substance and Sexual Activity   Alcohol Use Yes    Comment: Social     Social History     Substance and Sexual Activity   Drug Use No       Review of Systems   Constitutional: Negative for chills and fever.   HENT: Negative for trouble swallowing.         No jaw pain   Eyes: Negative for visual disturbance.   Respiratory: Negative for cough and wheezing.    Gastrointestinal: Negative for abdominal pain, nausea and vomiting.   Endocrine: Negative for cold intolerance and heat intolerance.   Genitourinary: Negative for dysuria.   Musculoskeletal: Negative for arthralgias and gait problem.        No left shoulder pain   Skin:  Negative for rash.   Neurological: Negative for dizziness, weakness and headaches.   Hematological: Does not bruise/bleed easily.   Psychiatric/Behavioral: Negative for dysphoric mood. The patient is not nervous/anxious.         Examination  Vitals: BP 134/86    Pulse 92    Temp 37.1 C (98.8 F)    Resp 18    Ht 1.626 m (5\' 4" )    Wt 88.9 kg (196 lb)    LMP 03/19/2015    SpO2 98%    BMI 33.64 kg/m         Physical Exam  Vitals and nursing note reviewed.   Constitutional:       General: She is not in acute distress.     Appearance: Normal appearance. She is well-developed.   HENT:      Head: Normocephalic and atraumatic.      Mouth/Throat:      Mouth: Mucous membranes are moist.      Pharynx: Oropharynx is clear.   Eyes:      Extraocular Movements: Extraocular movements intact.      Conjunctiva/sclera: Conjunctivae normal.      Pupils: Pupils are equal, round, and reactive to light.   Cardiovascular:      Rate and Rhythm: Normal rate and regular rhythm.   Pulmonary:      Effort: Pulmonary effort is normal. No respiratory distress.      Breath sounds: Normal breath sounds. No wheezing.   Chest:      Chest wall: No tenderness.   Abdominal:      General: Bowel sounds are normal. There is no  distension.      Palpations: Abdomen is soft.      Tenderness: There is no abdominal tenderness. There is no guarding or rebound.   Musculoskeletal:      Cervical back: Normal range of motion and neck supple.      Comments: Increased swelling over her right lower extremity   Skin:     General: Skin is warm and dry.   Neurological:      Mental Status: She is alert and oriented to person, place, and time.      Cranial Nerves: No cranial nerve deficit.   Psychiatric:         Mood and Affect: Mood normal.         Behavior: Behavior normal.         Thought Content: Thought content normal.         Assessment and Plan  Erica Harper was seen today for chest tightness.    Chest pain, unspecified type  Her chest pain has not improved.  Her husband is in the clinic and will drive her to the emergency room for further evaluation.      Leg length discrepancy  Patient might need an ultrasound of her leg to rule out a blood clot due to the increased pain.  She does have an appointment with vascular surgery later this month      Return for follow up as scheduled.    Erica Pons, MD

## 2020-04-30 NOTE — ED Provider Notes (Signed)
[facility] Emergency Department  Resident Provider Note    Name: Erica Harper  Age and Gender: 60 y.o. female  PCP: [name], MD  Attending: Dr. Arlan Organ    Triage Note:   Chest Pain  (Chest pain since yesterday, yesterday on and off, but today steady)    Clinical Impression:     Encounter Diagnoses   Name Primary?    Edema     Liver lesion Yes     HPI:  Erica Harper is a 60 y.o. female  who presents to the ED today for chest pain and RLE swelling. Patient states that chest pain began yesterday evening as mild mid-sternal pain that was intermittent, but early this morning she began to experience mid-sternal chest pressure that remained constant throughout the day today. It does not radiate or worsen with exertion. Patient states she injured her right knee a few months ago and did physical therapy which improved the pain, however yesterday during a workout she began to feel pain in her right knee again. She additionally complains of mild right knee swelling that has been there for the past few months. 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 She did do some chest presses yesterday with light dumbbells which she does not usually do. Denies any shortness of breath/dyspnea on exertion. Denies abdominal pain, nausea/vomiting, extremity pain, diaphoresis. No history of blood clots in patient or family.      History Limitations: None     Review of Systems:  Positive findings in +BOLD  Constitutional: No fever, chills, weakness   Skin: No rash, diaphoresis  HENT: No headaches, congestion  Cardio: +chest pain. No palpitations  Respiratory: No cough, wheezing, SOB  GI:  No nausea, vomiting, abdominal pain  MSK: +knee pain, swelling.   Neuro: No numbness, tingling, focal weakness  Psychiatric: No mood changes, anxiety  All other systems reviewed and are negative.    Below information obtained from chart review and reviewed with patient:  Past Medical  History:   Diagnosis Date    Rosacea      Current Outpatient Medications   Medication Sig    atorvastatin (LIPITOR) 20 mg Oral Tablet Take 1 Tablet (20 mg total) by mouth Every evening    Ibuprofen (MOTRIN) 600 mg Oral Tablet Take 1 Tablet (600 mg total) by mouth Three times a day as needed for Pain (food)    multivitamin Oral Tablet Take 1 Tab by mouth Once a day     Allergies   Allergen Reactions    Penicillins Hives/ Urticaria     Past Surgical History:   Procedure Laterality Date    HX ADENOIDECTOMY      HX CESAREAN SECTION      x2    HX ENDOMETRIAL ABLATION      HX HYSTERECTOMY  2017    complete    HX OOPHORECTOMY           Family History   Problem Relation Age of Onset    Diabetes Maternal Grandfather     Breast Cancer Maternal Grandmother 80    Heart Disease Mother 22        stents    Hypertension (High Blood Pressure) Mother     Hypertension (High Blood Pressure) Father     Cancer Father         skin        Social History  Occupational History     Employer: [employer]     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     Comment: sp:  [employer]   Tobacco Use    Smoking status: Never Smoker    Smokeless tobacco: Never Used   Substance and Sexual Activity    Alcohol use: Yes     Comment: Social    Drug use: No    Sexual activity: Yes     Partners: Male     Birth control/protection: Vasectomy       Objective:  Nursing notes reviewed  ED Triage Vitals [04/30/20 1224]   BP (Non-Invasive) (!) 147/81   Heart Rate 97   Respiratory Rate 20   Temperature 36.7 C (98.1 F)   SpO2 98 %   Weight 89.1 kg (196 lb 6.9 oz)   Height 1.651 m ('5\' 5"' )     Filed Vitals:    04/30/20 1224 04/30/20 1245 04/30/20 1500   BP: (!) 147/81 (!) 159/90    Pulse: 97 97 95   Resp: '20 19 17   ' Temp: 36.7 C (98.1 F)     SpO2: 98% 98% 98%       Physical Exam  Constitutional: Pleasant 60 y.o. female appears stated age in good health, normal color, no cyanosis. Resting in bed in no acute distress.  HENT:   Head:  Normocephalic and atraumatic.  Eyes: EOMI, normal conjunctivae.  Neck: Trachea midline. Neck supple.  Cardiovascular: Regular rate and rhythm. No murmurs, rubs or gallops. Intact distal pulses.  Pulmonary/Chest: Normal work of breathing, no respiratory distress. CTAB without wheezing, rhonchi, rales.  Musculoskeletal: Lower extremities appear equal in size, no obvious edema noted. Mild non-focal tenderness to palpation of anterior right knee. Negative anterior and posterior drawer test, no varus or valgus laxity. MSK exam otherwise unremarkable.  Skin: Warm and dry. No rash, erythema, pallor or cyanosis.  Psychiatric: Normal mood and affect. Behavior is normal.   Neurological: Patient alert and responsive. Oriented to person, place, time. Moving all extremities equally and fully.    Labs:   Labs Reviewed   BASIC METABOLIC PANEL - Abnormal; Notable for the following components:       Result Value    BUN 8 (*)     All other components within normal limits    Narrative:     Estimated Glomerular Filtration Rate (eGFR) calculated using the CKD-EPI (2009) equation, intended for patients 69 years of age and older. If race and/or gender is not documented or "unknown," there will be no eGFR calculation.   D-DIMER - Abnormal; Notable for the following components:    D-DIMER 353 (*)     All other components within normal limits   TROPONIN-I - Normal   CBC/DIFF    Narrative:     The following orders were created for panel order CBC/DIFF.  Procedure                               Abnormality         Status                     ---------                               -----------         ------  CBC WITH DIFF[[id number]]                                    Final result                 Please view results for these tests on the individual orders.   CBC WITH DIFF       Imaging:  Results for orders placed or performed during the hospital encounter of 04/30/20   XR AP MOBILE CHEST     Status: None    Narrative    Valerye  J Mccollum    PROCEDURE DESCRIPTION: XR AP MOBILE CHEST    CLINICAL INDICATION: chest pain    TECHNIQUE: 1 views / 1 images submitted.    Heart size is normal. There is no focal consolidation or pulmonary edema. No evidence of pleural effusion or pneumothorax. Focal density at the medial right base is most likely either prominent mediastinal fat or a pericardial cyst.      Impression    1. Focal density at the medial right base is likely to represent either prominent mediastinal fat or a pericardial cyst. No prior imaging to evaluate for stability.  2. Otherwise no acute process.      Radiologist location ID: [id number]     CT ANGIO CHEST FOR PULMONARY EMBOLUS W IV CONTRAST     Status: None    Narrative    Donielle J Vore    PROCEDURE DESCRIPTION: CT ANGIO CHEST FOR PULMONARY EMBOLUS W IV CONTRAST    CTA chest was performed to evaluate for pulmonary embolism with 3D/MIP reconstructions generated.    CLINICAL INDICATION: chest pain, elevated d-dimer    COMPARISON: No prior studies were compared.      FINDINGS: There is a 3 cm low-density focus in the right hepatic lobe which is partially visualized. This may be a cyst or hemangioma. Recommend ultrasound follow-up to confirm. At least 2 additional low-density lesions are noted in the right hepatic lobe. There are degenerative changes of the spine. There is no acute thoracic aortic abnormality or pulmonary embolus. No infiltrate or pleural effusion is demonstrated.      Impression    1. No pulmonary embolus demonstrated.  2. Low-density liver lesions as described. Suggest short interval ultrasound follow-up not emergently.            The CT exam was performed using one or more of the following dose reduction techniques: Automated exposure control, adjustment of the mA and/or kV according to the patient's size, or use of iterative reconstruction technique.        Radiologist location ID: [id number]         EKG: Sinus rhythm. No ST elevations, no signs of acute ischemia or  infarction.     Orders:  Orders Placed This Encounter    XR AP MOBILE CHEST    CT ANGIO CHEST FOR PULMONARY EMBOLUS W IV CONTRAST    US LIVER    CBC/DIFF    BASIC METABOLIC PANEL    TROPONIN-I    D-DIMER    CBC WITH DIFF    ECG 12 LEAD - ED USE    PERIPHERAL VENOUS DUPLEX - LOWER    ioversol (OPTIRAY 320) infusion       --------------------------------------------------------------------------------------------------------------------------------    Course and MDM:  Patient seen and examined. Labs and imaging reviewed.  MARYFRANCES PORTUGAL is a 60 y.o. female without  significant PMH who presents with midsternal chest pain and right knee pain/mild swelling.       Patient remained hemodynamically stable with no acute changes in clinical status throughout ED course   Patient reports that she was told by physical therapy a couple months ago that her right knee circumference was 1 inch greater than that of her left knee, however on my exam today I don't appreciate swelling. In-depth knee exam does not reveal any obvious tenderness/deformity/laxity - do not believe there is significant acute injury at this time. Leg is otherwise normal size without redness or warmth and patient is frequently active, so it does not seem that patient has DVT. D-dimer ordered for further evaluation.    Chest pain workup initiated including EKG, CXR, troponin - all unremarkable. Patient states chest pain has improved as she has been sitting in ED.    D-dimer positive at 353 - CT PE and duplex of RLE ordered for further evaluation   CT PE reveals no evidence of PE, however does reveal incidental finding of low-density liver lesions in right hepatic lobe   RLE venous duplex shows no evidence of DVT   Discussed incidental liver lesion finding with patient and patient's husband - explained that she will need non-emergent PCP follow up/ultrasound for this at some point and that it's unclear at this time what the lesions are. Patient and  husband both expressed understanding.   Based on negative imaging, reassuring labs, and reassuring history/physical exam - patient deemed safe for discharge at this time with PCP follow up. Strict ED return precautions given including but not limited to worsened/continued chest pain, shortness of breath, nausea/vomiting/abdominal pain associated with chest pain. Patient and patient's husband expressed understanding and are agreeable to plan.    Following the above history, physical exam, and studies, the patient was deemed stable and suitable for discharge. The patient was advised to return to the ED for any new or worsening symptoms. Discharge, medication and follow up instructions were discussed with the patient, who verbalized understanding. The patient is comfortable with the plan of care.    Disposition: Discharged    Follow up:   [facility] - Emergency Department  [place] [address]  [phone]    As needed, If symptoms worsen    Woofter, [place], MD  [address]  [place] [address]  [phone]    Call       Ernest Pine MD 04/30/2020, 12:31   PGY-1 Emergency Medicine  [facility]      *Parts of this patients chart were completed in a retrospective fashion due to simultaneous direct patient care activities in the Emergency Department.   *This note was partially generated using MModal Fluency Direct system, and there may be some incorrect words, spellings, and punctuation that were not noted in checking the note before saving.

## 2020-04-30 NOTE — Telephone Encounter (Signed)
Pt will be in at 11:15am.

## 2020-04-30 NOTE — ED Attending Note (Signed)
Reviewed H and P from resident / mid-level provider and I have personally seen and examined the patient.  I agree with findings as emphasized or unless addended below:    History:  CP since yseterday.  Palpitations.  Overnight chest pressure and constant during the day.  Improved currently.  No ACS history.  HLD history.  Sts has been doing PT for knee injury this past winter.   PCP sent here for DVT rule out dt the swelling of the RLE.  Also PE rule out per PCP bc of the chest pain.  No VTE history.  Nonsmoker.  No long travels.    PMH, PSH, medications, allergies, SH, and FH per resident note. Important aspects of these fields pertaining to today's visit taken into consideration during history/physical and MDM.    Exam:  Constitutional: Pt is oriented to person, place, and time. Pt appears well-developed and well-nourished. No distress.   HENT:   Head: Normocephalic and atraumatic.   Eyes: EOM are normal. Pupils are equal, round, and reactive to light.   Neck: Normal range of motion. Neck supple. No tracheal deviation present.   Pulmonary/Chest: Effort normal. No respiratory distress.   Heart: RRR, no murmur  Abdomen: Soft, nontender, nondistended, no peritoneal signs  Musculoskeletal: Normal range of motion. Pt exhibits no edema or deformity. RLE swollen compared to LLE somewhat.  Neurological: Pt is alert and oriented to person, place, and time. No cranial nerve deficit.   Gross motor intact   Skin: Skin is warm and dry.     EKG (if done), radiology and lab studies reviewed by me.    Labs Reviewed   BASIC METABOLIC PANEL - Abnormal; Notable for the following components:       Result Value    BUN 8 (*)     All other components within normal limits    Narrative:     Estimated Glomerular Filtration Rate (eGFR) calculated using the CKD-EPI (2009) equation, intended for patients 99 years of age and older. If race and/or gender is not documented or "unknown," there will be no eGFR calculation.   D-DIMER - Abnormal;  Notable for the following components:    D-DIMER 353 (*)     All other components within normal limits   TROPONIN-I - Normal   CBC/DIFF    Narrative:     The following orders were created for panel order CBC/DIFF.  Procedure                               Abnormality         Status                     ---------                               -----------         ------                     CBC WITH DIFF[[id number]]                                    Final result                 Please view results for these tests on the individual  orders.   CBC WITH DIFF       MDM:   During the patient's stay in the emergency department, images and/or labs were performed to assist with medical decision making and were reviewed by myself. After review of testing and in conjunction with the clinical history and physical exam, patient can be discharged with outpatient follow-up.    Impression:   Encounter Diagnoses   Name Primary?    Edema     Liver lesion Yes       Plan: Discharged    Disposition home with outpatient followup    Wyatt Haste, MD  04/30/2020, 12:41

## 2020-04-30 NOTE — ED Nurses Note (Signed)
Pt was given discharge instructions. Pt verbalized understanding. IV removed prior to discharge. Pt discharge home with family.

## 2020-04-30 NOTE — Telephone Encounter (Signed)
Pt states she stated back taking her ATORVASTATIN and now she is having sharp chest pains.... she wanted to know what Dr. Lear Ng suggests....     [phone]

## 2020-04-30 NOTE — Telephone Encounter (Signed)
I do not think that the Lipitor is causing sharp chest pain".   She can come in at noon today or go to the emergency room for further evaluation

## 2020-04-30 NOTE — ED Attending Handoff Note (Addendum)
Chief Complaint   Patient presents with   . Chest Pain      Chest pain since yesterday, yesterday on and off, but today steady     Chest pain. RLE edema. Assumed care from Dr. Freddi Che.    BP (!) 159/90   Pulse 97   Temp 36.7 C (98.1 F)   Resp 19   Ht 1.651 m ('5\' 5"' )   Wt 89.1 kg (196 lb 6.9 oz)   LMP 03/19/2015   SpO2 98%   BMI 32.69 kg/m     Results up to the Time the Disposition was Entered   BASIC METABOLIC PANEL - Abnormal; Notable for the following components:       Result Value    BUN 8 (*)     All other components within normal limits    Narrative:     Estimated Glomerular Filtration Rate (eGFR) calculated using the CKD-EPI (2009) equation, intended for patients 37 years of age and older. If race and/or gender is not documented or "unknown," there will be no eGFR calculation.   D-DIMER - Abnormal; Notable for the following components:    D-DIMER 353 (*)     All other components within normal limits   TROPONIN-I - Normal   ECG 12 LEAD - ED USE    Narrative:     Normal sinus rhythm  Possible Left atrial enlargement  Borderline ECG  No previous ECGs available   CBC/DIFF    Narrative:     The following orders were created for panel order CBC/DIFF.  Procedure                               Abnormality         Status                     ---------                               -----------         ------                     CBC WITH DIFF[[id number]]                                    Final result                 Please view results for these tests on the individual orders.   CBC WITH DIFF   XR AP MOBILE CHEST    Narrative:     Erica Harper    PROCEDURE DESCRIPTION: XR AP MOBILE CHEST    CLINICAL INDICATION: chest pain    TECHNIQUE: 1 views / 1 images submitted.    Heart size is normal. There is no focal consolidation or pulmonary edema. No evidence of pleural effusion or pneumothorax. Focal density at the medial right base is most likely either prominent mediastinal fat or a pericardial cyst.     PERIPHERAL  VENOUS DUPLEX - LOWER   CT ANGIO CHEST FOR PULMONARY EMBOLUS W IV CONTRAST     dispo = duplex, ct pe. HEART score low risk    Erica Lambert, MD  04/30/2020, 14:35  [facility]    Parts of this patient's chart  was completed in a retrospective fashion due to simultaneous direct patient care in the Emergency Department.   This note was partially generated using MModal Fluency Direct system, and there may be some incorrect syntax, spelling, and punctuation that were not noted before saving. In such instances, original meaning may be extrapolated by contextual derivation.    ADDENDUM:  Duplex shows no blood clot.  CT PE shows no evidence of pneumonia, effusion, pneumothorax.  Liver lesions present.  Unknown if hemangioma versus cyst.  Heart score low risk.  Results were discussed with patient.  She was given the opportunity to ask questions.  Outpatient liver ultrasound ordered.  Patient follow up primary care physician within the next 3-5 days.  All questions answered.  Patient agreeable to plan.    Erica Lambert, MD  04/30/2020, 15:20  [facility]    Parts of this patient's chart was completed in a retrospective fashion due to simultaneous direct patient care in the Emergency Department.   This note was partially generated using MModal Fluency Direct system, and there may be some incorrect syntax, spelling, and punctuation that were not noted before saving. In such instances, original meaning may be extrapolated by contextual derivation.

## 2020-05-01 LAB — ECG 12 LEAD - ED USE
Atrial Rate: 84 {beats}/min
Calculated P Axis: 52 degrees
Calculated R Axis: 26 degrees
Calculated T Axis: 77 degrees
PR Interval: 138 ms
QRS Duration: 70 ms
QT Interval: 354 ms
QTC Calculation: 418 ms
Ventricular rate: 84 {beats}/min

## 2020-05-15 ENCOUNTER — Ambulatory Visit (INDEPENDENT_AMBULATORY_CARE_PROVIDER_SITE_OTHER): Payer: Self-pay | Admitting: Family

## 2020-05-15 ENCOUNTER — Other Ambulatory Visit: Payer: Self-pay

## 2020-05-15 ENCOUNTER — Encounter (INDEPENDENT_AMBULATORY_CARE_PROVIDER_SITE_OTHER): Payer: Self-pay | Admitting: Vascular & Interventional Radiology

## 2020-05-15 VITALS — BP 140/90 | HR 98 | Temp 95.9°F | Ht 65.0 in | Wt 197.0 lb

## 2020-05-15 DIAGNOSIS — I872 Venous insufficiency (chronic) (peripheral): Secondary | ICD-10-CM

## 2020-05-15 DIAGNOSIS — I8393 Asymptomatic varicose veins of bilateral lower extremities: Secondary | ICD-10-CM

## 2020-05-15 DIAGNOSIS — R600 Localized edema: Secondary | ICD-10-CM

## 2020-05-15 DIAGNOSIS — M2180 Other specified acquired deformities of unspecified limb: Secondary | ICD-10-CM

## 2020-05-15 NOTE — H&P (Cosign Needed)
[facility]  [address]  [place] [address]  Phone: [phone]  Fax: [phone]      Encounter Date: 05/15/2020    Patient ID:  Erica Harper  AJO:[id number]    DOB: 1960-08-27  Age: 60 y.o. female    Subjective:     Chief Complaint   Patient presents with   . Leg Swelling     Pt in room 9 presents with c/o leg circumference differance       HPI  Erica Harper is a 60 y.o. female who was seen today for right lower extremity edema. She had a right leg venous duplex on  04/30/20 that was negative for DVT. Patient reports she has had right leg swelling since November. She denies trauma. She also has varicose veins that have been there for several years. She has a family history of her mother and grandmother having varicose veins. She does not wear compression stockings. She complains of leg achiness. Her symptoms are improved in the morning. As she works throughout the day, her symptoms worsen. She also feels as if she is walking on rocks.    Current Outpatient Medications   Medication Sig   . atorvastatin (LIPITOR) 20 mg Oral Tablet Take 1 Tablet (20 mg total) by mouth Every evening (Patient not taking: Reported on 05/15/2020)   . Ibuprofen (MOTRIN) 600 mg Oral Tablet Take 1 Tablet (600 mg total) by mouth Three times a day as needed for Pain (food)   . multivitamin Oral Tablet Take 1 Tab by mouth Once a day     Allergies   Allergen Reactions   . Penicillins Hives/ Urticaria       Past Medical History:   Diagnosis Date   . Hypercholesterolemia    . Rosacea          Past Surgical History:   Procedure Laterality Date   . HX ADENOIDECTOMY     . HX CESAREAN SECTION      x2   . HX ENDOMETRIAL ABLATION     . HX HYSTERECTOMY  2017    complete   . HX OOPHORECTOMY           Family Medical History:     Problem Relation (Age of Onset)    Breast Cancer Maternal Grandmother (96)    Cancer Father    Diabetes Maternal Grandfather    Heart Disease Mother (75)    Hypertension (High Blood  Pressure) Mother, Father          Social History     Tobacco Use   . Smoking status: Never Smoker   . Smokeless tobacco: Never Used   Substance Use Topics   . Alcohol use: Yes     Comment: Social   . Drug use: No       Review of  Systems:     Constitutional Negative for: Fever, Chills, Weight Loss, Malaise/Fatigue, Weakness, Diaphoresis     Skin Negative for: Rash, Itching     HENT Negative for: Headaches, Hearing Loss, Tinnitus, Ear Pain, Ear Discharge, Nosebleeds, Congestion, Sore Throat, Stridor     Eyes Negative for: Blurred Vision, Double Vision, Photophobia, Eye Pain, Eye Discharge, Eye Redness  Cardiovascular Positive for: Leg Swelling  Cardiovascular Negative for: Chest Pain, Palpitations, PND, Claudication, Orthopnea     Respiratory Negative for: Cough, Shortness of Breath, Wheezing, Sputum Production, Hemoptysis     Gastrointestinal Negative for: Heartburn, Nausea, Vomiting, Abdominal Pain, Diarrhea,  Constipation, Blood in Stool     Genitourinary Negative for: Hematuria, Urgency, Frequency, Dysuria, Flank Pain  Musculoskeletal: Joint Pain  Musculoskeletal Negative for: Neck Pain, Back Pain, Myalgias     Endo/Heme/Allergy Negative for: Easy Bruise/Bleed, Env Allergies, Polydipsia  Neurological Positive for: Tingling  Neurological Negative for : Dizziness, Tremor, Sensory Change, Speech Change, Focal Weakness, Seizures, LOC     Psychological Negative for: Depression, Suicidal Ideas, Substance Abuse, Hallucinations, Nervoucs/Anxious, Insomnia, Memory Loss                          Objective:   Vitals: BP (!) 140/90   Pulse 98   Temp 35.5 C (95.9 F)   Ht 1.651 m (5\' 5" )   Wt 89.4 kg (197 lb)   LMP 03/19/2015   SpO2 99%   BMI 32.78 kg/m         General Exam:    General:  appears in good health, moderately obese, no distress and vital signs reviewed  Extremities:  varicose veins noted, Moderate right leg edema  Neurologic: Grossly normal, CN II - XII grossly intact , Alert and oriented x3  Psychiatric:   Normal affect, behavior, memory, thought content, judgement, and speech.  Vascular    pulses 2+ throughout    Ancillary Tests Reviewed:    CONCLUSION    Study is negative for right lower extremity deep venous thrombosis    Clinical correlation is suggested    Sheppard Plumber, MD FACS  05/01/2020, 22:05        ENCOUNTER DIAGNOSES     ICD-10-CM   1. Venous insufficiency of both lower extremities  I87.2   2. 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 Leg edema, right  R60.0   4. Varicose veins of both lower extremities  I83.93       Assessment     Chronic bilateral lower extremity venous insufficiency  Bilateral lower extremity varicose veins       Preliminary venous reflux study suggests severe right lower extremity deep and superficial venous reflux and severe left lower extremity superficial venous reflux.  Low probability of DVT.  Varicose veins noted    Plan     Venous reflux study in office today  Recommend wearing graduated compression stockings of 20-30 mmHg daily with routine leg elevation and NSAIDs for pain relief  We will follow-up in 3 months for re-evaluation      Orders Placed This Encounter   . [id number] - DUPLEX SCAN EXTREMITY VEINS W/ RESPONSES TO COMPRESSION; COMPLETE BILAT STUDY (AMB ONLY)   . DME - COMPRESSION STOCKINGS       Return in about 3 months (around 08/15/2020).    Daneil Dan, NP    Dr. Kallie Edward has personally spoken with and examined the patient in the room as well as had a conversation about the treatment plan with Daneil Dan, NP      This note was partially generated using MModal Fluency Direct system, and there may be some incorrect words, spellings, and punctuation that were not noted in checking the note before saving.

## 2020-08-16 ENCOUNTER — Encounter (INDEPENDENT_AMBULATORY_CARE_PROVIDER_SITE_OTHER): Payer: Self-pay

## 2020-09-11 ENCOUNTER — Telehealth (INDEPENDENT_AMBULATORY_CARE_PROVIDER_SITE_OTHER): Payer: Self-pay | Admitting: Family Medicine

## 2020-09-11 NOTE — Telephone Encounter (Signed)
I called patient and she was able to come in on Thursday at noon

## 2020-09-11 NOTE — Telephone Encounter (Signed)
Pt said she wanted to come in to see you before August 25th because that is her next appointment with you.

## 2020-09-11 NOTE — Telephone Encounter (Signed)
Is she referring to her appointment with vascular surgery on August 15th?  If yes, then she can try to call their office to be put on a cancellation list.

## 2020-09-11 NOTE — Telephone Encounter (Signed)
Pain is having pain in her feet and it isn't getting better. Dr said to stop taking medication and she did and is still isn't better. She is wanting to know if there is anything sooner than august.     [phone]

## 2020-09-11 NOTE — Telephone Encounter (Signed)
Can she come in on Thursday at noon?  If yes, please let me know and schedule her

## 2020-09-13 ENCOUNTER — Ambulatory Visit (INDEPENDENT_AMBULATORY_CARE_PROVIDER_SITE_OTHER): Payer: BC Managed Care – PPO | Admitting: Family Medicine

## 2020-09-13 ENCOUNTER — Other Ambulatory Visit: Payer: BC Managed Care – PPO | Attending: Family Medicine | Admitting: Family Medicine

## 2020-09-13 ENCOUNTER — Encounter (INDEPENDENT_AMBULATORY_CARE_PROVIDER_SITE_OTHER): Payer: Self-pay | Admitting: Family Medicine

## 2020-09-13 VITALS — BP 138/90 | HR 84 | Temp 98.2°F | Resp 18 | Wt 196.0 lb

## 2020-09-13 DIAGNOSIS — R030 Elevated blood-pressure reading, without diagnosis of hypertension: Secondary | ICD-10-CM

## 2020-09-13 DIAGNOSIS — I872 Venous insufficiency (chronic) (peripheral): Secondary | ICD-10-CM | POA: Insufficient documentation

## 2020-09-13 DIAGNOSIS — E785 Hyperlipidemia, unspecified: Secondary | ICD-10-CM

## 2020-09-13 DIAGNOSIS — R200 Anesthesia of skin: Secondary | ICD-10-CM

## 2020-09-13 LAB — VITAMIN B12: VITAMIN B 12: 314 pg/mL (ref 180–914)

## 2020-09-13 MED ORDER — GABAPENTIN 100 MG CAPSULE
ORAL_CAPSULE | ORAL | 0 refills | Status: DC
Start: 2020-09-13 — End: 2021-01-02

## 2020-09-13 MED ORDER — ATORVASTATIN 20 MG TABLET
20.0000 mg | ORAL_TABLET | Freq: Every evening | ORAL | 0 refills | Status: DC
Start: 2020-09-13 — End: 2021-01-02

## 2020-09-13 NOTE — Progress Notes (Signed)
[facility]  [place] [id number]        Encounter Date: 09/13/2020 12:00 PM EDT      Name: Erica Harper  Age: 60 y.o.  DOB: 01-10-1961  Sex: female    Chief Complaint:   Chief Complaint   Patient presents with   . Feet Hurt       HPI   Patient is here for numbness in both of her feet that radiate to below her knees since January.  She feels likes she is "walking on rocks". She had not had an EMG in the past.  Patient was on Lipitor for 2 months and was not sure if that medication was contributing to her symptoms.  She did discontinue the medication and her symptoms have not improved.    History   Family Medical History:     Problem Relation (Age of Onset)    Breast Cancer Maternal Grandmother (96)    Cancer Father    Diabetes Maternal Grandfather    Heart Disease Mother (45)    Hypertension (High Blood Pressure) Mother, Father        Past Medical History:   Diagnosis Date   . Hypercholesterolemia    . Rosacea      Past Surgical History:   Procedure Laterality Date   . HX ADENOIDECTOMY     . HX CESAREAN SECTION      x2   . HX ENDOMETRIAL ABLATION     . HX HYSTERECTOMY  2017    complete   . HX OOPHORECTOMY       Patient Active Problem List    Diagnosis   . Venous insufficiency of both lower extremities   . Hyperlipidemia LDL goal <100   . Atrophic vaginitis     Current Outpatient Medications   Medication Sig   . atorvastatin (LIPITOR) 20 mg Oral Tablet Take 1 Tablet (20 mg total) by mouth Every evening   . gabapentin (NEURONTIN) 100 mg Oral Capsule Take one cap po qhs x3d. Then 2 cap po qhs x 3d. Then 3 cap po qhs   . multivitamin Oral Tablet Take 1 Tab by mouth Once a day     Social History     Tobacco Use   Smoking Status Never Smoker   Smokeless Tobacco Never Used     Social History     Substance and Sexual Activity   Alcohol Use Yes    Comment: Social     Social History     Substance and Sexual Activity   Drug Use No       Review of Systems   Constitutional: Negative for chills and  fever.   HENT: Negative for sneezing.    Eyes: Negative for visual disturbance.   Respiratory: Negative for cough and shortness of breath.    Cardiovascular: Negative for chest pain and palpitations.   Gastrointestinal: Negative for abdominal pain, nausea and vomiting.   Endocrine: Negative for cold intolerance and heat intolerance.   Genitourinary: Negative for dysuria and pelvic pain.   Musculoskeletal: Negative for gait problem.   Skin: Negative for rash.   Allergic/Immunologic:        She had her 1st COVID booster   Neurological: Negative for dizziness, facial asymmetry, speech difficulty, weakness and headaches.   Hematological: Does not bruise/bleed easily.   Psychiatric/Behavioral: Negative for confusion and decreased concentration.        Patient feels safe at home  Examination  Vitals: BP (!) 138/90   Pulse 84   Temp 36.8 C (98.2 F)   Resp 18   Wt 88.9 kg (196 lb)   LMP 03/19/2015   SpO2 95%   BMI 32.62 kg/m         Physical Exam  Vitals and nursing note reviewed.   Constitutional:       General: She is not in acute distress.     Appearance: Normal appearance. She is well-developed.   HENT:      Head: Normocephalic and atraumatic.      Nose: Nose normal.      Mouth/Throat:      Mouth: Mucous membranes are moist.      Pharynx: Oropharynx is clear.   Eyes:      Extraocular Movements: Extraocular movements intact.      Conjunctiva/sclera: Conjunctivae normal.      Pupils: Pupils are equal, round, and reactive to light.   Cardiovascular:      Rate and Rhythm: Normal rate and regular rhythm.      Pulses: Normal pulses.      Comments: 2+ posterior tibial pulses bilaterally  Pulmonary:      Effort: Pulmonary effort is normal. No respiratory distress.      Breath sounds: Normal breath sounds. No wheezing.   Abdominal:      General: Bowel sounds are normal. There is no distension.      Palpations: Abdomen is soft.      Tenderness: There is no abdominal tenderness.   Musculoskeletal:      Cervical  back: Normal range of motion and neck supple.      Right lower leg: No edema.      Left lower leg: No edema.      Comments: Normal gait   Skin:     General: Skin is warm and dry.   Neurological:      Mental Status: She is alert and oriented to person, place, and time.      Cranial Nerves: No cranial nerve deficit.      Sensory: No sensory deficit.      Comments: No gross sensory loss to light touch over her feet and lower extremities.   Psychiatric:         Mood and Affect: Mood normal.         Behavior: Behavior normal.         Thought Content: Thought content normal.         Judgment: Judgment normal.         Assessment and Plan  Shontai was seen today for feet hurt.    Numbness in feet  Reviewed her labs with her from February.  Her glucose has not been elevated.  She has also had a normal TSH within the past 8 months.  Check vitamin B12 level.  Patient will start gabapentin and understands the addictive potential of the medication.  She does not have a history of illicit drug/alcohol abuse.  She was also cautioned about possible sedation.  Refer to Neurology for further evaluation.  [facility] was last reviewed on 09/13/2020  2:08 PM by Daschel Roughton and result was REVIEWED PDMP.     Based on this, I have no concerns for prescribing controlled substances at this time.        -     VITAMIN B12; Future  -     Refer to Rodney Langton MD; Future  -     gabapentin (NEURONTIN) 100  mg Oral Capsule; Take one cap po qhs x3d. Then 2 cap po qhs x 3d. Then 3 cap po qhs    Hyperlipidemia LDL goal <100  Patient is going to restart the Lipitor.  Recheck cholesterol panel in 6-8 weeks.  -     atorvastatin (LIPITOR) 20 mg Oral Tablet; Take 1 Tablet (20 mg total) by mouth Every evening    Venous insufficiency of both lower extremities  Reviewed her note from vascular surgery from March.  Patient does have the compression stockings.  She has a follow-up with vascular surgery next month    Elevated blood pressure reading  Patient states  she becomes nervous when she comes to the doctor's office.  She will stop by for blood pressure recheck in 1-2 weeks.        Return in 4 months (on 01/14/2021) for physical.    [name], MD

## 2020-09-14 ENCOUNTER — Telehealth (INDEPENDENT_AMBULATORY_CARE_PROVIDER_SITE_OTHER): Payer: Self-pay | Admitting: Family Medicine

## 2020-09-14 NOTE — Telephone Encounter (Signed)
Pt.notified

## 2020-09-14 NOTE — Telephone Encounter (Signed)
-----   Message from [name], MD sent at 09/13/2020  5:44 PM EDT -----  Vitamin B12 is low normal.  She can take vitamin B12 1000 mcg daily

## 2020-09-18 ENCOUNTER — Encounter: Payer: BC Managed Care – PPO | Admitting: Specialist

## 2020-09-18 DIAGNOSIS — R200 Anesthesia of skin: Secondary | ICD-10-CM

## 2020-10-01 ENCOUNTER — Encounter (INDEPENDENT_AMBULATORY_CARE_PROVIDER_SITE_OTHER): Payer: Self-pay | Admitting: Vascular & Interventional Radiology

## 2020-10-11 ENCOUNTER — Encounter (INDEPENDENT_AMBULATORY_CARE_PROVIDER_SITE_OTHER): Payer: Self-pay | Admitting: Family Medicine

## 2020-10-23 ENCOUNTER — Encounter (INDEPENDENT_AMBULATORY_CARE_PROVIDER_SITE_OTHER): Payer: Self-pay | Admitting: Vascular & Interventional Radiology

## 2020-11-15 ENCOUNTER — Ambulatory Visit (INDEPENDENT_AMBULATORY_CARE_PROVIDER_SITE_OTHER): Payer: BC Managed Care – PPO

## 2020-11-15 DIAGNOSIS — Z23 Encounter for immunization: Secondary | ICD-10-CM

## 2020-12-04 ENCOUNTER — Other Ambulatory Visit (INDEPENDENT_AMBULATORY_CARE_PROVIDER_SITE_OTHER): Payer: Self-pay | Admitting: Vascular & Interventional Radiology

## 2020-12-04 ENCOUNTER — Ambulatory Visit (INDEPENDENT_AMBULATORY_CARE_PROVIDER_SITE_OTHER): Payer: BC Managed Care – PPO | Admitting: Primary Care

## 2020-12-04 ENCOUNTER — Encounter (INDEPENDENT_AMBULATORY_CARE_PROVIDER_SITE_OTHER): Payer: Self-pay | Admitting: Vascular & Interventional Radiology

## 2020-12-04 ENCOUNTER — Other Ambulatory Visit: Payer: Self-pay

## 2020-12-04 VITALS — BP 130/80 | HR 87 | Temp 97.5°F | Ht 64.0 in | Wt 199.0 lb

## 2020-12-04 DIAGNOSIS — I83813 Varicose veins of bilateral lower extremities with pain: Secondary | ICD-10-CM

## 2020-12-04 DIAGNOSIS — I872 Venous insufficiency (chronic) (peripheral): Secondary | ICD-10-CM

## 2020-12-04 NOTE — Progress Notes (Cosign Needed)
[facility]  [address]  [place] [address]  Phone: [phone]  Fax: [phone]      Encounter Date: 12/04/2020    Patient ID:  Erica Harper  TIR:[id number]    DOB: 1960/10/02  Age: 60 y.o. female    Subjective:     Chief Complaint   Patient presents with    CVI     Pt in room 4 presents for a 5 month f/u cvi       HPI  Erica Harper is a 60 y.o. female who was seen today for 3 month follow up for chronic venous insufficiency. she has ultrasound evidence of bilateral greater saphenous vein reflux.  Has been utilizing conservative therapy with support stockings, routine leg elevation, increased exercise, and OTC pain relievers.  she reports no symptoms with conservative treatment. She has complaints of painful large ropey veins to her right thigh    Current Outpatient Medications   Medication Sig    atorvastatin (LIPITOR) 20 mg Oral Tablet Take 1 Tablet (20 mg total) by mouth Every evening (Patient not taking: Reported on 12/04/2020)    cyanocobalamin (VITAMIN B 12) 1,000 mcg Oral Tablet Take 1,000 mcg by mouth Once a day    gabapentin (NEURONTIN) 100 mg Oral Capsule Take one cap po qhs x3d. Then 2 cap po qhs x 3d. Then 3 cap po qhs (Patient not taking: Reported on 12/04/2020)    multivitamin Oral Tablet Take 1 Tab by mouth Once a day     Allergies   Allergen Reactions    Penicillins Hives/ Urticaria       Past Medical History:   Diagnosis Date    Hypercholesterolemia     Rosacea          Past Surgical History:   Procedure Laterality Date    HX ADENOIDECTOMY      HX CESAREAN SECTION      x2    HX ENDOMETRIAL ABLATION      HX HYSTERECTOMY  2017    complete    HX OOPHORECTOMY           Family Medical History:     Problem Relation (Age of Onset)    Breast Cancer Maternal Grandmother (96)    Cancer Father    Diabetes Maternal Grandfather    Heart Disease Mother (1)    Hypertension (High Blood Pressure) Mother, Father          Social History     Tobacco Use     Smoking status: Never    Smokeless tobacco: Never   Substance Use Topics    Alcohol use: Yes     Comment: Social    Drug use: No       Review of  Systems:     Constitutional Negative for: Fever, Chills, Weight Loss, Malaise/Fatigue, Diaphoresis, Weakness     Skin Negative for: Rash, Itching     HENT Negative for: Headaches, Hearing Loss, Tinnitus, Ear Pain, Ear Discharge, Nosebleeds, Congestion, Stridor, Sore Throat     Eyes Negative for: Blurred Vision, Double Vision, Photophobia, Eye Pain, Eye Discharge, Eye Redness     Cardiovascular Negative for: Chest Pain, Palpitations, Orthopnea, Claudication, Leg Swelling, PND     Respiratory Negative for: Cough, Hemoptysis, Sputum Production, Shortness of Breath, Wheezing     Gastrointestinal Negative for: Heartburn, Nausea, Vomiting, Abdominal Pain, Diarrhea, Constipation, Blood in Stool, Melena     Genitourinary Negative for: Flank Pain, Hematuria,  Frequency, Urgency, Dysuria     Musculoskeletal Negative for: Myalgias, Neck Pain, Back Pain, Joint Pain, Falls     Endo/Heme/Allergy Negative for: Easy Bruise/Bleed, Env Allergies, Polydipsia  Neurological Positive for: Tingling (bilateral feet)  Neurological Negative for : Dizziness, Tremor, Sensory Change, Speech Change, Seizures, Focal Weakness, LOC     Psychological Negative for: Depression, Suicidal Ideas, Substance Abuse, Hallucinations, Nervoucs/Anxious, Insomnia, Memory Loss                          Objective:   Vitals: BP 130/80   Pulse 87   Temp 36.4 C (97.5 F)   Ht 1.626 m (5\' 4" )   Wt 90.3 kg (199 lb)   LMP 03/19/2015   SpO2 99%   BMI 34.16 kg/m         General Exam:    General:  appears in good health, no distress and vital signs reviewed  Lungs:  Clear to auscultation bilaterally.   Cardiovascular:  regular rate and rhythm     Vascular    +2 pulses  Extremities:  Large ropey varicose veins to right leg. no venous stasis dermatitis.    Skin:  Skin warm and dry, No rashes and No lesions    Ancillary  Tests Reviewed:  Venous reflux study from 10/23/2020 shows significant right greater saphenous vein reflux. Low probability of deep vein thrombosis.        ENCOUNTER DIAGNOSES     ICD-10-CM   1. Varicose veins of both lower extremities with pain  I83.813   2. Venous reflux  I87.2       Assessment     The patient has signs and symptoms of chronic venous hypertension, affecting daily function, despite a complete trial of 20-30 mmHg compression stockings, legs elevation, avoiding prolonged standing, exercise, and taking ibuprofen daily for leg pain.       CEAP 3- Varicose Veins and Edema (without skin changes)    Plan     Will plan for endovenous ablations of the incompetent vein(s).  Right     greater saphenous vein endovenous ablation with phlebectomy   The procedure, risks and complications have been discussed with the patient, which include but not limited to bleeding, infection, thermal injury, numbness and possibility of thrombosis. The patient understands and wishes to proceed. All questions have been answered      MEDICAL NECESSITY REVIEW: The patient has significant pain and limitation of activities of daily living because of varicose veins. Conservative therapy with 3 month trial of adequate use of medical grade support stockings, legs elevation, avoiding prolonged standing, exercise, and taking ibuprofen daily for leg pain have provided no lasting relief of the pain and other debilitating symptoms. Radiofrequency and/or Laser endovenous ablations as outlined above is the only way to afford this patient any lasting relief, all conservative regimens having failed.         Orders Placed This Encounter    DME - [place]       Return for H&P.    [place] Undrea Shipes, APRN,NP-C    Patient was seen independently in clinic. The cosigning physician, Dr. Kallie Edward, was available for consultation.

## 2020-12-28 ENCOUNTER — Encounter (INDEPENDENT_AMBULATORY_CARE_PROVIDER_SITE_OTHER): Payer: BC Managed Care – PPO | Admitting: Family Medicine

## 2020-12-28 ENCOUNTER — Encounter (INDEPENDENT_AMBULATORY_CARE_PROVIDER_SITE_OTHER): Payer: Self-pay | Admitting: Family Medicine

## 2021-01-02 ENCOUNTER — Encounter (INDEPENDENT_AMBULATORY_CARE_PROVIDER_SITE_OTHER): Payer: Self-pay | Admitting: Family Medicine

## 2021-01-02 ENCOUNTER — Telehealth (INDEPENDENT_AMBULATORY_CARE_PROVIDER_SITE_OTHER): Payer: Self-pay | Admitting: Family Medicine

## 2021-01-02 ENCOUNTER — Ambulatory Visit (INDEPENDENT_AMBULATORY_CARE_PROVIDER_SITE_OTHER): Payer: BC Managed Care – PPO | Admitting: Family Medicine

## 2021-01-02 VITALS — BP 120/90 | HR 102 | Temp 97.3°F | Resp 18 | Ht 65.0 in | Wt 194.0 lb

## 2021-01-02 DIAGNOSIS — Z0000 Encounter for general adult medical examination without abnormal findings: Secondary | ICD-10-CM

## 2021-01-02 DIAGNOSIS — K769 Liver disease, unspecified: Secondary | ICD-10-CM

## 2021-01-02 DIAGNOSIS — Z23 Encounter for immunization: Secondary | ICD-10-CM

## 2021-01-02 DIAGNOSIS — Z1231 Encounter for screening mammogram for malignant neoplasm of breast: Secondary | ICD-10-CM

## 2021-01-02 DIAGNOSIS — E785 Hyperlipidemia, unspecified: Secondary | ICD-10-CM

## 2021-01-02 DIAGNOSIS — I83813 Varicose veins of bilateral lower extremities with pain: Secondary | ICD-10-CM

## 2021-01-02 NOTE — Telephone Encounter (Signed)
1. Please let patient know that I am going to schedule a liver ultrasound to follow-up on the liver densities seen on her CT scan from earlier this spring.  2. She has a follow-up scheduled December 8 and can cancel that appointment.  She needs to schedule her regular follow-up in 6 month

## 2021-01-02 NOTE — Telephone Encounter (Signed)
Pt notified and December appointment cancelled.

## 2021-01-02 NOTE — Progress Notes (Signed)
E[facility] MED  [place] [address]        Encounter Date: 01/02/2021 11:30 AM EST      Name: Erica Harper  Age: 60 y.o.  DOB: 12/11/1960  Sex: female    Chief Complaint:   Chief Complaint   Patient presents with    Annual Exam       HPI   Patient is here for her annual physical exam.  Patient had a mammogram last September and is due for her follow-up mammogram.  She is status post complete hysterectomy.    She will have surgery for her right varicose vein on 01/25/2021 with Dr. Hyacinth Meeker at [facility].  Patient has chronic leg pain due to her varicose veins.    History   Family Medical History:     Problem Relation (Age of Onset)    Breast Cancer Maternal Grandmother (96)    Cancer Father    Diabetes Maternal Grandfather    Heart Disease Mother (63)    Hypertension (High Blood Pressure) Mother, Father        Past Medical History:   Diagnosis Date    Rosacea      Past Surgical History:   Procedure Laterality Date    HX ADENOIDECTOMY      HX CESAREAN SECTION      x2    HX ENDOMETRIAL ABLATION      HX HYSTERECTOMY  2017    complete    HX OOPHORECTOMY       Patient Active Problem List    Diagnosis    Venous insufficiency of both lower extremities    Hyperlipidemia LDL goal <100    Atrophic vaginitis     Current Outpatient Medications   Medication Sig    cyanocobalamin (VITAMIN B 12) 1,000 mcg Oral Tablet Take 1,000 mcg by mouth Once a day    multivitamin Oral Tablet Take 1 Tab by mouth Once a day     Social History     Tobacco Use   Smoking Status Never   Smokeless Tobacco Never     Social History     Substance and Sexual Activity   Alcohol Use Yes    Comment: Social     Social History     Substance and Sexual Activity   Drug Use No       Review of Systems   Constitutional: Negative for chills and fever.   HENT: Negative for congestion and trouble swallowing.    Eyes: Negative for visual disturbance.   Respiratory: Negative for cough and shortness of breath.     Cardiovascular: Negative for chest pain and palpitations.   Gastrointestinal: Negative for abdominal pain and blood in stool.   Endocrine: Negative for cold intolerance and heat intolerance.   Genitourinary: Negative for pelvic pain and vaginal bleeding.   Musculoskeletal: Negative for arthralgias and gait problem.        Patient was having increased muscle pain on the Lipitor.  The symptoms did improve after she discontinued the medication.   Skin: Negative for rash.   Allergic/Immunologic:        She has well tolerated the flu vaccine in the past   Neurological: Negative for dizziness and weakness.   Hematological: Does not bruise/bleed easily.   Psychiatric/Behavioral: Negative for dysphoric mood. The patient is not nervous/anxious.         Examination  Vitals: BP (!) 120/90    Pulse (!) 102  Temp 36.3 C (97.3 F)    Resp 18    Ht 1.651 m (5\' 5" )    Wt 88 kg (194 lb)    LMP 03/19/2015    SpO2 97%    BMI 32.28 kg/m         Physical Exam  Vitals and nursing note reviewed.   Constitutional:       General: She is not in acute distress.     Appearance: Normal appearance. She is well-developed.   HENT:      Head: Normocephalic and atraumatic.      Nose: Nose normal.      Mouth/Throat:      Mouth: Mucous membranes are moist.      Pharynx: Oropharynx is clear.   Eyes:      Extraocular Movements: EOM normal.      Conjunctiva/sclera: Conjunctivae normal.      Pupils: Pupils are equal, round, and reactive to light.   Cardiovascular:      Rate and Rhythm: Normal rate and regular rhythm.      Pulses: Normal pulses.      Comments: 2+ posterior tibial pulses bilaterally  Pulmonary:      Effort: Pulmonary effort is normal. No respiratory distress.      Breath sounds: Normal breath sounds. No wheezing.   Abdominal:      General: Bowel sounds are normal. There is no distension.      Palpations: Abdomen is soft.      Tenderness: There is no abdominal tenderness.   Genitourinary:     General: Normal vulva.      Vagina: No  vaginal discharge.      Comments: No external genital lesions.  Cervix and uterus are surgically absent.  Ovaries were not palpable.    Breasts-no abnormal masses.  No nipple abnormality.  No axillary lymphadenopathy  Musculoskeletal:      Cervical back: Normal range of motion and neck supple.      Right lower leg: Edema present.      Left lower leg: Edema present.      Comments: Varicose veins in her lower extremities bilaterally.  Trace pedal edema bilaterally   Skin:     General: Skin is warm and dry.      Coloration: Skin is not pale.   Neurological:      Mental Status: She is alert and oriented to person, place, and time.      Cranial Nerves: No cranial nerve deficit.      Coordination: Coordination normal.      Comments: Speech is clear.  Good eye contact.  Attention span and concentration appear to be normal   Psychiatric:         Mood and Affect: Mood and affect and mood normal.         Behavior: Behavior normal.         Thought Content: Thought content normal.         Judgment: Judgment normal.         Assessment and Plan  [name] was seen today for annual exam.    Physical exam  Reviewed her colonoscopy report.  Patient is going to have fasting labs later this month.  BMI addressed: Advised on diet, weight loss, and exercise to reduce above normal BMI.    Need for influenza vaccination  She well tolerated the flu vaccine  -     Flucelvax (Egg-Free) Flu Vaccine,6 month- adult,0.5 mL IM (Admin)    Varicose veins  of bilateral lower extremities with pain  Patient will have surgical intervention next month    Encounter for screening mammogram for malignant neoplasm of breast  Schedule mammogram.  -     MAMMO BILATERAL SCREENING-ADDL VIEWS/BREAST US AS REQ BY RAD; Future    Hyperlipidemia LDL goal <100  She cannot tolerate Lipitor but it can try another statin if her LDL is elevated.  -     THYROID STIMULATING HORMONE (SENSITIVE TSH); Future  -     LIPID PANEL; Future  -     COMPREHENSIVE METABOLIC PANEL,  NON-FASTING; Future    Liver disorder  Schedule follow-up ultrasound due to the liver densities found on CT scan earlier this spring  -     US ABDOMEN COMPLETE (ADULT); Future        Return in about 6 months (around 07/02/2021) for folllow up.    [name], MD

## 2021-01-06 ENCOUNTER — Inpatient Hospital Stay
Admission: RE | Admit: 2021-01-06 | Discharge: 2021-01-06 | Disposition: A | Discharge status: Home / Self Care | Payer: BC Managed Care – PPO | Source: Ambulatory Visit | Attending: Family Medicine | Admitting: Family Medicine

## 2021-01-06 ENCOUNTER — Inpatient Hospital Stay (HOSPITAL_COMMUNITY)
Admission: RE | Admit: 2021-01-06 | Discharge: 2021-01-06 | Disposition: A | Discharge status: Home / Self Care | Payer: BC Managed Care – PPO | Source: Ambulatory Visit | Attending: Vascular & Interventional Radiology | Admitting: Vascular & Interventional Radiology

## 2021-01-06 ENCOUNTER — Other Ambulatory Visit (HOSPITAL_COMMUNITY): Payer: BC Managed Care – PPO

## 2021-01-06 ENCOUNTER — Other Ambulatory Visit (INDEPENDENT_AMBULATORY_CARE_PROVIDER_SITE_OTHER): Payer: Self-pay | Admitting: Family Medicine

## 2021-01-06 ENCOUNTER — Other Ambulatory Visit: Payer: Self-pay

## 2021-01-06 DIAGNOSIS — K769 Liver disease, unspecified: Secondary | ICD-10-CM

## 2021-01-06 DIAGNOSIS — I83813 Varicose veins of bilateral lower extremities with pain: Secondary | ICD-10-CM

## 2021-01-06 DIAGNOSIS — E785 Hyperlipidemia, unspecified: Secondary | ICD-10-CM | POA: Insufficient documentation

## 2021-01-06 LAB — COMPREHENSIVE METABOLIC PANEL, NON-FASTING
ALBUMIN: 4.2 g/dL (ref 3.2–4.8)
ALKALINE PHOSPHATASE: 75 U/L (ref 20–130)
ALT (SGPT): 19 U/L (ref <=52)
ANION GAP: 6 mmol/L
AST (SGOT): 18 U/L (ref <=35)
BILIRUBIN TOTAL: 0.6 mg/dL (ref 0.3–1.2)
BUN/CREA RATIO: 14
BUN: 12 mg/dL (ref 10–25)
CALCIUM: 9.3 mg/dL (ref 8.8–10.3)
CHLORIDE: 105 mmol/L (ref 98–111)
CO2 TOTAL: 29 mmol/L (ref 21–35)
CREATININE: 0.85 mg/dL (ref <=1.30)
ESTIMATED GFR: >60 mL/min/{1.73_m2}
GLUCOSE: 83 mg/dL (ref 70–110)
POTASSIUM: 4.4 mmol/L (ref 3.5–5.0)
PROTEIN TOTAL: 6.8 g/dL (ref 6.0–8.3)
SODIUM: 140 mmol/L (ref 135–145)

## 2021-01-06 LAB — CARBON DIOXIDE (CO2, BICARBONATE): CO2 TOTAL: 29 mmol/L (ref 21–35)

## 2021-01-06 LAB — ECG 12 LEAD - ADULT
Atrial Rate: 77 {beats}/min
Calculated P Axis: 55 degrees
Calculated R Axis: 34 degrees
Calculated T Axis: 63 degrees
PR Interval: 142 ms
QRS Duration: 76 ms
QT Interval: 376 ms
QTC Calculation: 425 ms
Ventricular rate: 77 {beats}/min

## 2021-01-06 LAB — LIPID PANEL
CHOLESTEROL: 246 mg/dL — ABNORMAL HIGH (ref 0–199)
HDL CHOL: 88 mg/dL (ref >40)
LDL DIRECT: 131 mg/dL — ABNORMAL HIGH (ref 0–99)
TRIGLYCERIDES: 63 mg/dL (ref 0–199)
VLDL CALC: 13 mg/dL (ref 0–50)

## 2021-01-06 LAB — CBC WITH DIFF
BASOPHIL #: <0.1 10*3/uL (ref <=0.20)
BASOPHIL %: 1 %
EOSINOPHIL #: 0.23 10*3/uL (ref <=0.50)
EOSINOPHIL %: 4 %
HCT: 42.8 % (ref 34.8–46.0)
HGB: 14 g/dL (ref 11.5–16.0)
IMMATURE GRANULOCYTE #: <0.1 10*3/uL (ref <0.10)
IMMATURE GRANULOCYTE %: 0 % (ref 0–1)
LYMPHOCYTE #: 1.87 10*3/uL (ref 1.00–4.80)
LYMPHOCYTE %: 35 %
MCH: 29.4 pg (ref 26.0–32.0)
MCHC: 32.7 g/dL (ref 31.0–35.5)
MCV: 89.9 fL (ref 78.0–100.0)
MONOCYTE #: 0.48 10*3/uL (ref 0.20–1.10)
MONOCYTE %: 9 %
MPV: 10.4 fL (ref 8.7–12.5)
NEUTROPHIL #: 2.77 10*3/uL (ref 1.50–7.70)
NEUTROPHIL %: 51 %
PLATELETS: 336 10*3/uL (ref 150–400)
RBC: 4.76 10*6/uL (ref 3.85–5.22)
RDW-CV: 14 % (ref 11.5–15.5)
WBC: 5.4 10*3/uL (ref 3.7–11.0)

## 2021-01-06 LAB — PT/INR: INR: 1.08 (ref 0.80–1.10)

## 2021-01-06 LAB — THYROID STIMULATING HORMONE (SENSITIVE TSH): TSH: 2.671 u[IU]/mL (ref 0.450–5.330)

## 2021-01-07 ENCOUNTER — Other Ambulatory Visit (INDEPENDENT_AMBULATORY_CARE_PROVIDER_SITE_OTHER): Payer: Self-pay | Admitting: Family Medicine

## 2021-01-07 ENCOUNTER — Telehealth (INDEPENDENT_AMBULATORY_CARE_PROVIDER_SITE_OTHER): Payer: Self-pay | Admitting: Family Medicine

## 2021-01-07 DIAGNOSIS — K7689 Other specified diseases of liver: Secondary | ICD-10-CM

## 2021-01-07 MED ORDER — SIMVASTATIN 20 MG TABLET
20.0000 mg | ORAL_TABLET | Freq: Every evening | ORAL | 1 refills | Status: DC
Start: 2021-01-07 — End: 2021-05-22

## 2021-01-07 NOTE — Telephone Encounter (Signed)
-----   Message from [name], MD sent at 01/06/2021  7:33 PM EST -----  Please refer her to GI for evaluation of multiple hepatic cysts.

## 2021-01-07 NOTE — Telephone Encounter (Signed)
-----   Message from [name], MD sent at 01/06/2021  7:32 PM EST -----  1. CBC and Chem 14 are grossly within normal limits  2. LDL is elevated at 131.  She cannot tolerate Lipitor due to muscle aches.  She can try Zocor 20 mg 1 every evening.  Recheck cholesterol panel, Chem 14, CPK in 6 weeks  3. TSH is within normal limits

## 2021-01-07 NOTE — Telephone Encounter (Signed)
Pt notified and referral put in computer.

## 2021-01-07 NOTE — Telephone Encounter (Signed)
Pt.notified

## 2021-01-09 ENCOUNTER — Other Ambulatory Visit (INDEPENDENT_AMBULATORY_CARE_PROVIDER_SITE_OTHER): Payer: Self-pay | Admitting: Family Medicine

## 2021-01-09 ENCOUNTER — Encounter (INDEPENDENT_AMBULATORY_CARE_PROVIDER_SITE_OTHER): Payer: Self-pay

## 2021-01-21 ENCOUNTER — Telehealth (INDEPENDENT_AMBULATORY_CARE_PROVIDER_SITE_OTHER): Payer: Self-pay | Admitting: Family Medicine

## 2021-01-21 NOTE — Telephone Encounter (Signed)
Left message for pt

## 2021-01-21 NOTE — Telephone Encounter (Signed)
Patient is calling in stating she was put on Zocor and she has been having a lot of muscle cramps. She was wondering if there was another medication she can take the doesn't do that?    [phone]

## 2021-01-21 NOTE — Telephone Encounter (Signed)
Stop the Zocor and let me know how symptoms are in 1-2 weeks.

## 2021-01-24 ENCOUNTER — Encounter (INDEPENDENT_AMBULATORY_CARE_PROVIDER_SITE_OTHER): Payer: Self-pay | Admitting: Family Medicine

## 2021-01-25 ENCOUNTER — Ambulatory Visit (INDEPENDENT_AMBULATORY_CARE_PROVIDER_SITE_OTHER): Payer: BC Managed Care – PPO | Admitting: Vascular Surgery

## 2021-01-25 ENCOUNTER — Encounter (INDEPENDENT_AMBULATORY_CARE_PROVIDER_SITE_OTHER): Payer: Self-pay | Admitting: Vascular Surgery

## 2021-01-25 ENCOUNTER — Other Ambulatory Visit: Payer: Self-pay

## 2021-01-25 VITALS — BP 120/80 | HR 87 | Temp 96.5°F | Ht 64.0 in | Wt 196.0 lb

## 2021-01-25 DIAGNOSIS — I872 Venous insufficiency (chronic) (peripheral): Secondary | ICD-10-CM

## 2021-01-25 DIAGNOSIS — I83813 Varicose veins of bilateral lower extremities with pain: Secondary | ICD-10-CM

## 2021-01-25 DIAGNOSIS — Z01818 Encounter for other preprocedural examination: Secondary | ICD-10-CM

## 2021-01-25 NOTE — H&P (Signed)
[facility]   Vascular Surgery   History and Physical      Maille, Halliwell  Date of Birth:  [birth year]  PCP:  [name], MD    Information Obtained from: patient and history reviewed via medical record  Chief Complaint:  Painful varicose veins    HPI:  Patient is a 60 y.o. female who was seen today for preoperative history and physical prior to right leg endovenous ablation and phlebectomy.she has ultrasound evidence of bilateral greater saphenous vein reflux.  Has been utilizing conservative therapy with support stockings, routine leg elevation, increased exercise, and OTC pain relievers.  she reports no symptoms with conservative treatment. She has complaints of painful large ropey veins to her right thigh.  Discussed risks and benefits of right leg endovenous ablation with the patient, she agreed to proceed.      ROS Other than ROS in the HPI, all other systems were negative.        Past Medical History:   Diagnosis Date   . Rosacea          Allergies   Allergen Reactions   . Penicillins Hives/ Urticaria   . Lipitor [Atorvastatin] Myalgia     Problems with anesthesia:  No    Current Outpatient Medications   Medication Sig   . cyanocobalamin (VITAMIN B 12) 1,000 mcg Oral Tablet Take 1,000 mcg by mouth Once a day   . multivitamin Oral Tablet Take 1 Tab by mouth Once a day   . simvastatin (ZOCOR) 20 mg Oral Tablet Take 1 Tablet (20 mg total) by mouth Every evening        Past Surgical History:   Procedure Laterality Date   . HX ADENOIDECTOMY     . HX CESAREAN SECTION      x2   . HX ENDOMETRIAL ABLATION     . HX HYSTERECTOMY  2017    complete   . HX OOPHORECTOMY           Family History:     Family Medical History:     Problem Relation (Age of Onset)    Breast Cancer Maternal Grandmother (96)    Cancer Father    Diabetes Maternal Grandfather    Heart Disease Mother (35)    Hypertension (High Blood Pressure) Mother, Father         Social History     Tobacco Use   . Smoking status: Never   .  Smokeless tobacco: Never   Substance Use Topics   . Alcohol use: Yes     Comment: Social   . Drug use: No       Exam: Constitutional:  appears in good health  Respiratory:  Clear to auscultation bilaterally.   Cardiovascular:  regular rate and rhythm  regular rate and rhythm, S1, S2 normal, no murmur, click, rub or gallop     Vascular    pulses 2+ throughout  Integumentary:  Skin warm and dry, No rashes, No lesions and Large ropy varicose veins right leg  Neurologic:  Grossly normal, CN II - XII grossly intact , Alert and oriented x3    Labs:    Reviewed:    Last CBC  (Last result in the past 2 years)      WBC   HGB   HCT   MCV   Platelets      01/06/21 0719 5.4   14.0   42.8   89.9   336  Last BMP  (Last result in the past 2 years)      Na   K   Cl   CO2   BUN   Cr   Calcium   Glucose   Glucose-Fasting        01/06/21 0719 140   4.4   105   29   12   0.85   9.3   83         01/06/21 0719       29                      Lab Results   Component Value Date    INR 1.08 01/06/2021          Diagnostic Tests:  Reviewed:   Recent Results (from the past 720 hour(s))   ECG 12 LEAD - ADULT    Collection Time: 01/06/21  7:20 AM   Result Value    Ventricular rate 77    Atrial Rate 77    PR Interval 142    QRS Duration 76    QT Interval 376    QTC Calculation 425    Calculated P Axis 55    Calculated R Axis 34    Calculated T Axis 63    Narrative    Normal sinus rhythm  Normal ECG  When compared with ECG of 30-Apr-2020 12:25,  No significant change was found  Confirmed by Verner Mould ([id number]) on 01/06/2021 10:21:05 AM           Radiology Tests:    XR CHEST PA AND LATERAL    Result Date: 01/06/2021  Impression Chronic lung changes. No acute process. Radiologist location ID: [place]:  1. Pre-op exam    2. Venous insufficiency of both lower extremities    3. Varicose veins of both lower extremities with pain      Plan:   Discussed risks and benefits of right leg endovenous ablation  with phlebectomy as outlined on the informed consent, patient agrees to proceed.  Appropriate consents have been obtained.  Patient will follow up after surgery      Venancio Poisson, NP 01/25/2021  Patient was seen independently in clinic. The cosigning physician, Dr. Kallie Edward, was available for consultation.

## 2021-01-29 ENCOUNTER — Encounter (HOSPITAL_COMMUNITY): Payer: Self-pay | Admitting: Vascular & Interventional Radiology

## 2021-01-29 NOTE — Nursing Note (Signed)
COVID-19 Admission Screen    Low Risk:   Able to Provide asymptomatic History  None    Moderate/High Risk: {None    Test Result:Not Indicated

## 2021-02-01 ENCOUNTER — Inpatient Hospital Stay
Admission: RE | Admit: 2021-02-01 | Discharge: 2021-02-01 | Disposition: A | Discharge status: Home / Self Care | Payer: BC Managed Care – PPO | Source: Ambulatory Visit | Attending: Vascular & Interventional Radiology | Admitting: Vascular & Interventional Radiology

## 2021-02-01 ENCOUNTER — Encounter (HOSPITAL_COMMUNITY)
Admission: RE | Disposition: A | Discharge status: Home / Self Care | Payer: Self-pay | Source: Ambulatory Visit | Attending: Vascular & Interventional Radiology

## 2021-02-01 ENCOUNTER — Encounter (HOSPITAL_COMMUNITY): Payer: Self-pay | Admitting: Vascular & Interventional Radiology

## 2021-02-01 ENCOUNTER — Ambulatory Visit (HOSPITAL_COMMUNITY): Payer: BC Managed Care – PPO | Admitting: Certified Registered"

## 2021-02-01 ENCOUNTER — Other Ambulatory Visit: Payer: Self-pay

## 2021-02-01 ENCOUNTER — Ambulatory Visit (HOSPITAL_BASED_OUTPATIENT_CLINIC_OR_DEPARTMENT_OTHER): Payer: BC Managed Care – PPO | Admitting: Certified Registered"

## 2021-02-01 DIAGNOSIS — I872 Venous insufficiency (chronic) (peripheral): Secondary | ICD-10-CM

## 2021-02-01 DIAGNOSIS — I83891 Varicose veins of right lower extremities with other complications: Secondary | ICD-10-CM

## 2021-02-01 HISTORY — DX: Disorder of lipoprotein metabolism, unspecified: E78.9

## 2021-02-01 SURGERY — ENDOVENOUS ABLATION WITH RADIOFREQUENCY
Anesthesia: General | Site: Leg | Laterality: Right | Wound class: Clean Wound: Uninfected operative wounds in which no inflammation occurred

## 2021-02-01 MED ORDER — MIDAZOLAM 1 MG/ML INJECTION WRAPPER
INTRAMUSCULAR | Status: AC
Start: 2021-02-01 — End: 2021-02-01
  Filled 2021-02-01: qty 2

## 2021-02-01 MED ORDER — SODIUM CHLORIDE 0.9 % (FLUSH) INJECTION SYRINGE
3.0000 mL | INJECTION | Freq: Three times a day (TID) | INTRAMUSCULAR | Status: DC
Start: 2021-02-01 — End: 2021-02-01

## 2021-02-01 MED ORDER — PROCHLORPERAZINE EDISYLATE 10 MG/2 ML (5 MG/ML) INJECTION SOLUTION
5.0000 mg | Freq: Once | INTRAMUSCULAR | Status: DC | PRN
Start: 2021-02-01 — End: 2021-02-01

## 2021-02-01 MED ORDER — CLINDAMYCIN 900 MG/50 ML IN 5 % DEXTROSE INTRAVENOUS PIGGYBACK
INJECTION | Freq: Once | INTRAVENOUS | Status: DC | PRN
Start: 2021-02-01 — End: 2021-02-01
  Administered 2021-02-01: 900 mg via INTRAVENOUS

## 2021-02-01 MED ORDER — MEPERIDINE (PF) 25 MG/ML INJECTION SOLUTION
12.5000 mg | INTRAMUSCULAR | Status: DC | PRN
Start: 2021-02-01 — End: 2021-02-01

## 2021-02-01 MED ORDER — POLIDOCANOL 0.5 % (10 MG/2 ML) INTRAVENOUS SOLUTION
Freq: Once | INTRAVENOUS | Status: DC | PRN
Start: 2021-02-01 — End: 2021-02-01
  Administered 2021-02-01: 2 mL via INTRAMUSCULAR

## 2021-02-01 MED ORDER — PROPOFOL 10 MG/ML IV BOLUS
INJECTION | INTRAVENOUS | Status: AC
Start: 2021-02-01 — End: 2021-02-01
  Filled 2021-02-01: qty 100

## 2021-02-01 MED ORDER — LIDOCAINE HCL 20 MG/ML (2 %) INJECTION SOLUTION
INTRAMUSCULAR | Status: AC
Start: 2021-02-01 — End: 2021-02-01
  Filled 2021-02-01: qty 20

## 2021-02-01 MED ORDER — FENTANYL (PF) 50 MCG/ML INJECTION SOLUTION
INTRAMUSCULAR | Status: AC
Start: 2021-02-01 — End: 2021-02-01
  Filled 2021-02-01: qty 2

## 2021-02-01 MED ORDER — LIDOCAINE (PF) 100 MG/5 ML (2 %) INTRAVENOUS SYRINGE
INJECTION | Freq: Once | INTRAVENOUS | Status: DC | PRN
Start: 2021-02-01 — End: 2021-02-01
  Administered 2021-02-01: 50 mg via INTRAVENOUS

## 2021-02-01 MED ORDER — LABETALOL 5 MG/ML INTRAVENOUS SOLUTION
5.0000 mg | INTRAVENOUS | Status: DC | PRN
Start: 2021-02-01 — End: 2021-02-01

## 2021-02-01 MED ORDER — ONDANSETRON HCL (PF) 4 MG/2 ML INJECTION SOLUTION
4.0000 mg | Freq: Once | INTRAMUSCULAR | Status: DC | PRN
Start: 2021-02-01 — End: 2021-02-01

## 2021-02-01 MED ORDER — SODIUM CHLORIDE 0.9 % INTRAVENOUS SOLUTION
Freq: Once | INTRAVENOUS | Status: DC | PRN
Start: 2021-02-01 — End: 2021-02-01
  Administered 2021-02-01: 250 mL via INTRADERMAL

## 2021-02-01 MED ORDER — FENTANYL (PF) 50 MCG/ML INJECTION SOLUTION
Freq: Once | INTRAMUSCULAR | Status: DC | PRN
Start: 2021-02-01 — End: 2021-02-01
  Administered 2021-02-01 (×2): 50 ug via INTRAVENOUS

## 2021-02-01 MED ORDER — PHENYLEPHRINE 100 MCG/ML IV DILUTION - FOR ANES
INJECTION | Freq: Once | INTRAVENOUS | Status: DC | PRN
Start: 2021-02-01 — End: 2021-02-01
  Administered 2021-02-01 (×2): 100 ug via INTRAVENOUS

## 2021-02-01 MED ORDER — LACTATED RINGERS INTRAVENOUS SOLUTION
INTRAVENOUS | Status: DC
Start: 2021-02-01 — End: 2021-02-01

## 2021-02-01 MED ORDER — PROPOFOL 10 MG/ML IV - CHI
INTRAVENOUS | Status: DC | PRN
Start: 2021-02-01 — End: 2021-02-01
  Administered 2021-02-01: 0 ug/kg/min via INTRAVENOUS
  Administered 2021-02-01: 100 ug/kg/min via INTRAVENOUS

## 2021-02-01 MED ORDER — MIDAZOLAM 1 MG/ML INJECTION WRAPPER
Freq: Once | INTRAMUSCULAR | Status: DC | PRN
Start: 2021-02-01 — End: 2021-02-01
  Administered 2021-02-01 (×2): 1 mg via INTRAVENOUS

## 2021-02-01 MED ORDER — HYDRALAZINE 20 MG/ML INJECTION SOLUTION
5.0000 mg | Freq: Once | INTRAMUSCULAR | Status: DC | PRN
Start: 2021-02-01 — End: 2021-02-01

## 2021-02-01 MED ORDER — FENTANYL (PF) 50 MCG/ML INJECTION SOLUTION
25.0000 ug | INTRAMUSCULAR | Status: DC | PRN
Start: 2021-02-01 — End: 2021-02-01

## 2021-02-01 MED ORDER — SODIUM CHLORIDE 0.9 % INTRAVENOUS SOLUTION
Freq: Once | INTRAVENOUS | Status: DC | PRN
Start: 2021-02-01 — End: 2021-02-01
  Administered 2021-02-01: 100 mL via TOPICAL

## 2021-02-01 MED ORDER — HYDROMORPHONE (PF) 0.5 MG/0.5 ML INJECTION SYRINGE
0.5000 mg | INJECTION | INTRAMUSCULAR | Status: DC | PRN
Start: 2021-02-01 — End: 2021-02-01

## 2021-02-01 MED ORDER — SODIUM CHLORIDE 0.9 % (FLUSH) INJECTION SYRINGE
3.0000 mL | INJECTION | INTRAMUSCULAR | Status: DC | PRN
Start: 2021-02-01 — End: 2021-02-01

## 2021-02-01 SURGICAL SUPPLY — 49 items
ABLATION CLOSUREFAST 7FR 100CM 7CM RF THERM OVLP SYSTEM ENDOVENOUS STRL LF  DISP ACPT .025IN GW (CATHETERS) IMPLANT
APPL 72.5% ISPRP 0.83% PVP IOD_40ML PRVL FX PREP 1 STEP (MED SURG SUPPLIES) ×6 IMPLANT
BANDAGE 4.1YDX4.5IN 6 PLY HYPOALL COTTON LRG GAUZE WHT STRL LF  DISP (WOUND CARE SUPPLY) ×2 IMPLANT
BANDAGE 4.1YDX4.5IN 6 PLY HYPO_ALL COTTON LRG GAUZE WHT STRL (WOUND CARE/ENTEROSTOMAL SUPPLY) ×2
BANDAGE 5YDX6IN ELAS CLIP MTL_COMP WHT STRL LF (WOUND CARE/ENTEROSTOMAL SUPPLY)
BANDAGE ACE 5YDX6IN STRL ELAS CLIP METAL COTTON PLSTR COMPRESS WHT LF (WOUND CARE SUPPLY) IMPLANT
BANDAGE COFLX NL 5YDX4IN EASYTEAR CHSV CNTRL STRNG SFT (WOUND CARE/ENTEROSTOMAL SUPPLY) ×1
BANDAGE COFLX NL 5YDX4IN STRL CHSV SFT FOAM COMPRESS TAN LF (WOUND CARE SUPPLY) ×1 IMPLANT
BANDAGE COFLX NL 5YDX6IN_EASYTEAR ADH CHSV PAT TCH STRN (WOUND CARE/ENTEROSTOMAL SUPPLY) ×1
BANDAGE TETRA-FLX 11YDX6IN CLI_P FR WVN ELAS HVDTY COMP STRL (WOUND CARE/ENTEROSTOMAL SUPPLY) ×1
BANDAGE TETRA-FLX 11YDX6IN STRL HVDTY CLIP FREE ELAS COMPRESS LF (WOUND CARE SUPPLY) ×1 IMPLANT
CATH CLOSUREFAST 7FR 7CM 100CM_SV RFLX INTVN ENDOVENOUS LF (CATHETERS)
CATH CLOSUREFAST 7FR 7CM 60CM HEAT ELMT RF ABLATION INTVN ENDOVENOUS STRL LF  DISP ACPT .025IN GW (CATHETERS) ×1 IMPLANT
CATH CLOSUREFAST 7FR 7CM 60CM_HEAT ELMT RF ABLT INTVN (CATHETERS) ×1
CONV USE ITEM 313924 - BANDAGE COFLX NL 5YDX6IN_EASYTEAR ADH CHSV PAT TCH STRN (WOUND CARE SUPPLY) ×1 IMPLANT
CONV USE ITEM 325441 - GEL US AQSN 100 TRNMS OVRWRAP COMPLETE AQ FOIL PCH LF  STRL 20GM CLR (MED SURG SUPPLIES) IMPLANT
DCNTR FLUID DISPENSR BAG BAJ DISP STRL LF  ASPT TRANSF (IV TUBING & ACCESSORIES) ×1 IMPLANT
DCNTR FLUID DSPNSR BAG BAJ DIS_P STRL ASPT TRANSF (IV TUBING & ACCESSORIES) ×1
DRAPE SPLT FENESTRATE ABS REINF 108X77IN PRXM LF  STRL DISP SURG SMS 36X29IN BLU (DRAPE/PACKS/SHEETS/OR TOWEL) ×1 IMPLANT
DRAPE SPLT FENESTRATE ABS REIN_F 108X77IN PRXM LF STRL DISP (DRAPE/PACKS/SHEETS/OR TOWEL) ×1
DURAPREP 26ML 8630 CS/20 (MED SURG SUPPLIES) ×1
GEL US AQSN 100 COMPLETE AQ TR_NMS FOIL PCH LF STRL 20GM (MED SURG SUPPLIES)
GW CHC PT .014IN 35CM 300CM IN_MD TIP RADOPQ X RAIL SUP SS (WIRE)
GW CHC PT .014IN 35CM 300CM RADOPQ X RAIL SUP VAS STR (WIRE) IMPLANT
KIT INFS PUMP INFLTR TUMESCENT_SYRG TUBE VENEFIT PROC 108IN (IV TUBING & ACCESSORIES)
KIT INFUS PUMP INFLTR TUMESCENT SYRG TUBE VENEFIT PROC 108IN 12ML (IV TUBING & ACCESSORIES) IMPLANT
KIT IRRG 15.5FT 3/8IN EVLT SIL HIFLO VENACURE TUMESCENT DEL STRL DISP (ENDOSCOPIC SUPPLIES) ×1 IMPLANT
KIT MICROINTRO MNSTCK 7CM MAX 4FR 21GA TUNG NITINOL COAX STIFFEN GW ECHGN NEEDLE STRL DISP (VASCULAR) ×1 IMPLANT
KIT MICROINTRO MNSTCK 7CM MAX_4FR 21GA TUNG NITINOL COAX (VASCULAR) ×1
PACK PREMIUM FAST CFP_USE W/CLOSURE CATH (VASCULAR) ×1
PACK SURG CLOSUREFAST STRL DISP LF (VASCULAR) ×1 IMPLANT
SCALPEL OPTH SURG DISP (CUTTING ELEMENTS)
SCALPEL OPTH SURG DISP (SURGICAL CUTTING SUPPLIES) IMPLANT
SHEATH 7FR 10CM 2.5CM PINN R/O II DIL KINK RST SMOOTH TRNS INTROD ACPT .038IN GW (INTRODUCER) ×1 IMPLANT
SHEATH PINNACLE 7FR X 10CM_W/WIRE RSB712 (INTRODUCER) ×1
SOL SURG PREP 26ML DRPRP 74% ISPRP 0.7% IOD POVACRYLEX SLF CNTN APPL SKIN STRL PREOP (MED SURG SUPPLIES) ×1 IMPLANT
SPONGE LAP 18X18 RFD STRL_L181804P01C1 40PK/CS (MED SURG SUPPLIES) ×1
SPONGE LAP 18X18IN STD 4 PLY XRY RF DTBL ABS RFDETECT COTTON STRL LF  DISP (MED SURG SUPPLIES) ×1 IMPLANT
SPONGE SUPER KERLIX STRL_322059 DR ADENIYI 25PK/CS (MED SURG SUPPLIES) ×2 IMPLANT
STKNT ORTHO 36X9IN COTTON PRXM HVWT IMPRV LF  STRL (ORTHOPEDICS (NOT IMPLANTS)) ×1 IMPLANT
STKNT ORTHO 60X6IN COTTON 1 PLY STRL (ORTHOPEDICS (NOT IMPLANTS)) ×1 IMPLANT
STOCKINETTE 6 X 60_20/CS 950660 (ORTHOPEDICS (NOT IMPLANTS)) ×1
STOCKINETTE IMPERVIOUS 9X36_CS/15/ EA (ORTHOPEDICS (NOT IMPLANTS)) ×1
STOPCOCK IV 4W LRG BORE ROT MALE LL ADPR LPD RST DEHP-FR STRL LF (IV TUBING & ACCESSORIES) ×1 IMPLANT
STOPCOCK IV 4W LRG BORE ROT MA_LE LL ADPR LPD RST DEHP-FR (IV TUBING & ACCESSORIES) ×1
STRAP POSITION 19X3.5IN LEG TECLIN STRUP LITH NONST LF  DISP (MED SURG SUPPLIES) ×1 IMPLANT
STRAP POSITION 19X3.5IN TECLIN_STRUP LITH NONST LF DISP (MED SURG SUPPLIES) ×1
SUTURE NYL 4-0 FS2 ETHILON MTP_S 18IN BLK MONOF NONAB (SUTURE/WOUND CLOSURE) ×4 IMPLANT
TUBING PUMP TUMESCENT (INSTRUMENTS ENDOMECHANICAL) ×1

## 2021-02-01 NOTE — OR Surgeon (Signed)
OPERATIVE NOTE    Patient Name: Erica Harper  Age:  60 y.o.  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  Right leg varicose vein excision stab avulsion phlebectomy at greater than 20 sites  Right leg varicose vein  injection sclerotherapy at 3 sites    Indication:  Severe  right greater saphenous vein reflux with symptomatic varicose veins.  Failed conservative medical management    Narrative of Procedure:Indications contraindications risks benefits and alternative therapies to the procedure were discussed with the patient including the option for a 2nd opinion if deemed appropriate.  Appropriate consents were obtained.   The patient was brought to the operating room. Appropriate lines and tubes were placed. The right lower extremity was prepped and draped in sterile fashion standard for the procedure.     Using modified Seldinger technique, a 7 French sheath was placed in the right greater saphenous vein below the knee.  The VNUS ClosureFast catheter was inserted through the sheath.  The tip was positioned 2 centimeters from the saphenofemoral junction.  Tumescent  lidocaine was then used to infiltrate along the entire length of the vein deep to the saphenous fascia. Radiofrequency ablation of the Greater Saphenous  vein was done with confirmed closure by ultrasound.  Areas of previously marked large ropy varicose veins were infiltrated with tumescent lidocaine, stab incisions were made.  Avulsion phlebectomies were done at the sites. There were total of  20 sites. Hemostasis was achieved at the phlebotomy sites with a combination of Steri-Strips and 4:0 nylon sutures. Areas of previously marked small of varicose veins and large spider veins  were injected with polidocanol foam directly into the veins for injection sclerotherapy.  This was well tolerated.  The sheath was  removed. Hemostasis is secured at the puncture sites.  A sterile multilayer compression dressing was applied.     The patient tolerated the procedure well. There were no complications. Blood loss was minimal. Patient was transferred to the recovery room in a stable condition.      Findings:  Ultrasound confirmed closure right greater saphenous vein post ablation     Attending Surgeon: Sheppard Plumber, MD FACS    Assistant(s): None    Anesthesia Type: Monitor Anesthesia Care     Estimated Blood Loss:  Minimal    Blood Given: None    Fluids Given: Per Anesthesia Records     Complications (not routinely expected or not inherent to difficulty/nature of procedure): None    Characteristic Event (routinely expected or inherent to the difficulty/nature of the procedure): None    Did the use of current and/or prior Anticoagulants impact the outcome of the case?no    Wound Class: Clean Wound: Uninfected operative wounds in which no inflammation occurred    Tubes: None    Drains: None    Specimens/ Cultures: None    Implants: None           Disposition: PACU - hemodynamically stable.    Condition: stable    Sheppard Plumber, MD FACS12/16/202212:12

## 2021-02-01 NOTE — Anesthesia Postprocedure Evaluation (Signed)
Anesthesia Post Op Evaluation    Patient: Erica Harper  Procedure(s):  RIGHT LEG ENDOVENOUS ABLATION WITH PHLEBECTOMY    Last Vitals:Temperature: 36 C (96.8 F) (02/01/21 0949)  Heart Rate: 74 (02/01/21 1320)  BP (Non-Invasive): 115/69 (02/01/21 1320)  Respiratory Rate: 14 (02/01/21 1320)  SpO2: 98 % (02/01/21 1320)    No notable events documented.    Patient is sufficiently recovered from the effects of anesthesia to participate in the evaluation and has returned to their pre-procedure level.  Patient location during evaluation: PACU       Patient participation: complete - patient participated  Level of consciousness: awake    Pain score: 2  Pain management: adequate  Airway patency: patent    Anesthetic complications: no  Cardiovascular status: acceptable, blood pressure returned to baseline, hemodynamically stable and stable  Respiratory status: acceptable  Hydration status: acceptable  Patient post-procedure temperature: Pt Normothermic   PONV Status: Absent  Comments: Aldrete Score 9. OK to discharge

## 2021-02-01 NOTE — Anesthesia Transfer of Care (Signed)
ANESTHESIA TRANSFER OF CARE   Erica Harper is a 60 y.o. ,female, Weight: 88.9 kg (195 lb 15.8 oz)   had Procedure(s):  RIGHT LEG ENDOVENOUS ABLATION WITH PHLEBECTOMY  performed  02/01/21   Primary Service: Sheppard Plumber, MD *    Past Medical History:   Diagnosis Date   . Abnormal serum cholesterol    . Rosacea       Allergy History as of 02/01/21     PENICILLINS       Noted Status Severity Type Reaction    06/11/09 1044 Latanya Presser, [place] 45/80/99 Active High Topical Hives/ Urticaria          ATORVASTATIN       Noted Status Severity Type Reaction    01/02/21 1331 Woofter, Dietrich, MD 01/02/21 Active Low Intolerance Myalgia              I completed my transfer of care / handoff to the receiving personnel during which we discussed:  Access, Airway, All key/critical aspects of case discussed, Analgesia, Antibiotics, Expectation of post procedure, Fluids/Product, Gave opportunity for questions and acknowledgement of understanding, Labs and PMHx    Post Location: PACU                                                                  Last OR Temp: Temperature: 36 C (96.8 F)  ABG:  POTASSIUM   Date Value Ref Range Status   01/06/2021 4.4 3.5 - 5.0 mmol/L Final     CALCIUM   Date Value Ref Range Status   01/06/2021 9.3 8.8 - 10.3 mg/dL Final     Calculated P Axis   Date Value Ref Range Status   01/06/2021 55 degrees Final     Calculated R Axis   Date Value Ref Range Status   01/06/2021 34 degrees Final     Calculated T Axis   Date Value Ref Range Status   01/06/2021 63 degrees Final     Airway:* No LDAs found *  Blood pressure 99/64, pulse 85, temperature 36 C (96.8 F), resp. rate 14, height 1.626 m (5\' 4" ), weight 88.9 kg (195 lb 15.8 oz), last menstrual period 03/19/2015, SpO2 97 %, not currently breastfeeding.

## 2021-02-01 NOTE — OR PreOp (Signed)
COVID-19 Admission Screen    Low Risk:  None    Moderate/High Risk: {None    Test Result:Not Indicated

## 2021-02-01 NOTE — Discharge Instructions (Signed)
Discharge instructions given to pt.    Hospital Operator [phone]

## 2021-02-01 NOTE — Anesthesia Preprocedure Evaluation (Signed)
ANESTHESIA PRE-OP EVALUATION  Planned Procedure: RIGHT LEG ENDOVENOUS ABLATION WITH PHLEBECTOMY (Right: Leg)  Review of Systems                   Pulmonary     Cardiovascular    Hyperlipidemia ,No peripheral edema,        GI/Hepatic/Renal           Endo/Other    obesity,      Neuro/Psych/MS        Cancer                    Physical Assessment      Airway       Mallampati: I    TM distance: >3 FB    Neck ROM: full  Mouth Opening: fair.  No Facial hair  No Beard        Dental       Dentition intact             Pulmonary    Breath sounds clear to auscultation  (-) no rhonchi, no decreased breath sounds, no wheezes, no rales and no stridor     Cardiovascular    Rhythm: regular  Rate: Normal  (-) no friction rub, carotid bruit is not present, no peripheral edema and no murmur     Other findings            Plan  ASA 2     Planned anesthesia type: general     total intravenous anesthesia                  Intravenous induction     Anesthesia issues/risks discussed are: Dental Injuries, PONV, Post-op Pain Management, Post-op Cognitive Dysfunction, Aspiration, Intraoperative Awareness/ Recall, Stroke, Cardiac Events/MI, Post-op Agitation/Tantrum, Blood Loss, Difficult Airway and Sore Throat.  Anesthetic plan and risks discussed with patient             Patient's NPO status is appropriate for Anesthesia.           Plan discussed with CRNA.

## 2021-02-01 NOTE — H&P (Signed)
[facility]   Vascular Surgery   History and Physical      Erica Harper, Erica Harper  Date of Birth:  1960/05/30  PCP:  [name], MD    Information Obtained from: patient and history reviewed via medical record  Chief Complaint:  Painful varicose veins    HPI:  Patient is a 60 y.o. female who was seen today for preoperative history and physical prior to right leg endovenous ablation and phlebectomy.shehas ultrasoundevidence of bilateralgreater saphenous vein reflux. Has been utilizing conservative therapy with support stockings, routine leg elevation, increased exercise, and OTC pain relievers. shereports nosymptoms with conservative treatment. She has complaints of painful large ropey veins to her right thigh.  Discussed risks and benefits of right leg endovenous ablation with the patient, she agreed to proceed.      ROS Other than ROS in the HPI, all other systems were negative.             Past Medical History:   Diagnosis Date   . Rosacea               Allergies   Allergen Reactions   . Penicillins Hives/ Urticaria   . Lipitor [Atorvastatin] Myalgia     Problems with anesthesia:  No         Current Outpatient Medications   Medication Sig   . cyanocobalamin (VITAMIN B 12) 1,000 mcg Oral Tablet Take 1,000 mcg by mouth Once a day   . multivitamin Oral Tablet Take 1 Tab by mouth Once a day   . simvastatin (ZOCOR) 20 mg Oral Tablet Take 1 Tablet (20 mg total) by mouth Every evening              Past Surgical History:   Procedure Laterality Date   . HX ADENOIDECTOMY     . HX CESAREAN SECTION      x2   . HX ENDOMETRIAL ABLATION     . HX HYSTERECTOMY  2017    complete   . HX OOPHORECTOMY           Family History:     Family Medical History:     Problem Relation (Age of Onset)    Breast Cancer Maternal Grandmother (96)    Cancer Father    Diabetes Maternal Grandfather    Heart Disease Mother (91)    Hypertension (High Blood Pressure) Mother, Father         Social  History     Tobacco Use   . Smoking status: Never   . Smokeless tobacco: Never   Substance Use Topics   . Alcohol use: Yes     Comment: Social   . Drug use: No       Exam: Constitutional:  appears in good health  Respiratory:  Clear to auscultation bilaterally.   Cardiovascular:  regular rate and rhythm  regular rate and rhythm, S1, S2 normal, no murmur, click, rub or gallop     Vascular    pulses 2+ throughout  Integumentary:  Skin warm and dry, No rashes, No lesions and Large ropy varicose veins right leg  Neurologic:  Grossly normal, CN II - XII grossly intact , Alert and oriented x3    Labs:    Reviewed:                  Last CBC  (Last result in the past 2 years)      WBC   HGB   HCT  MCV   Platelets      01/06/21 0719 5.4   14.0   42.8   89.9   336                                   Last BMP  (Last result in the past 2 years)      Na   K   Cl   CO2   BUN   Cr   Calcium   Glucose   Glucose-Fasting        01/06/21 0719 140   4.4   105   29   12   0.85   9.3   83         01/06/21 0719       29                            Lab Results   Component Value Date    INR 1.08 01/06/2021          Diagnostic Tests:  Reviewed:        Recent Results (from the past 720 hour(s))   ECG 12 LEAD - ADULT    Collection Time: 01/06/21  7:20 AM   Result Value    Ventricular rate 77    Atrial Rate 77    PR Interval 142    QRS Duration 76    QT Interval 376    QTC Calculation 425    Calculated P Axis 55    Calculated R Axis 34    Calculated T Axis 63    Narrative    Normal sinus rhythm  Normal ECG  When compared with ECG of 30-Apr-2020 12:25,  No significant change was found  Confirmed by Verner Mould ([id number]) on 01/06/2021 10:21:05 AM           Radiology Tests:    XR CHEST PA AND LATERAL    Result Date: 01/06/2021  Impression Chronic lung changes. No acute process. Radiologist location ID: [place]:  1. Pre-op exam    2.  Venous insufficiency of both lower extremities    3. Varicose veins of both lower extremities with pain      Plan:   Discussed risks and benefits of right leg endovenous ablation with phlebectomy as outlined on the informed consent, patient agrees to proceed.  Appropriate consents have been obtained.  Patient will follow up after surgery      Venancio Poisson, NP 01/25/2021  Patient was seen independently in clinic. The cosigning physician, Dr. Kallie Edward, was available for consultation.        Electronically signed by Venancio Poisson, NP at 01/25/21 [phone]  The history, physical exam was directly supervised by me. The interpretation of ancillary tests was performed by me. I also have  reviewed and confirmed the ROS, Past Medical Histoy, Past Family History, and exam elements performed and documented by the support staff. The scribed portion of the progress note was scribed on my behalf and at my direction.  I have reviewed and attest to the accuracy of the note.    Sheppard Plumber, MD [place]  H&P Update Form    Erica, Harper, 60 y.o. female  Date of Admission:  02/01/2021  Date of  Birth:  Jul 24, 1960    02/01/2021    STOP: IF H&P IS GREATER THAN 30 DAYS FROM SURGICAL DAY COMPLETE NEW H&P IS REQUIRED.     H & P updated the day of the procedure.  1.  H&P completed within 30 days of surgical procedure and has been reviewed within 24 hours of the surgery, the patient has been examined, and no change has occured in the patients condition since the H&P was completed.       Change in medications: No      Comments:     2.  Patient continues to be appropiate candidate for planned surgical procedure. YES    3.  In addition to the risks associated with this particular procedure that have been explained to the patient as well as the benefit to the procedure, the patient also understands that at this time there is additional environmental risks associated with the presence of COVID -19 in our hospital. Patient  consents to the procedure accepting this additional risk.    Sheppard Plumber, MD FACS

## 2021-02-04 ENCOUNTER — Encounter (INDEPENDENT_AMBULATORY_CARE_PROVIDER_SITE_OTHER): Payer: Self-pay | Admitting: Vascular & Interventional Radiology

## 2021-02-04 ENCOUNTER — Ambulatory Visit (INDEPENDENT_AMBULATORY_CARE_PROVIDER_SITE_OTHER): Payer: BC Managed Care – PPO | Admitting: Primary Care

## 2021-02-04 VITALS — BP 134/88 | HR 93 | Temp 96.8°F | Ht 64.0 in | Wt 196.0 lb

## 2021-02-04 DIAGNOSIS — Z9889 Other specified postprocedural states: Secondary | ICD-10-CM

## 2021-02-04 DIAGNOSIS — I872 Venous insufficiency (chronic) (peripheral): Secondary | ICD-10-CM

## 2021-02-04 NOTE — Progress Notes (Cosign Needed)
[facility]  [address]  [place] [address]  Phone: [phone]  Fax: [phone]      Encounter Date: 02/04/2021    Patient ID:  AZARYA OCONNELL  EVO:[id number]    DOB: 06/13/1960  Age: 60 y.o. female    Subjective:     Chief Complaint   Patient presents with    Post Op     Patient presents in clinic today for post op visit due to right greater saphenous vein radiofrequency endovenous ablation.       HPI  LASEAN RAHMING is a 60 y.o. female who was seen today for follow up after Right     leg greater saphenous vein endovenous ablation with phlebectomy at 20 sites and sclerotherapy at 3 sites.  she presents with no complaints over the weekend.      Current Outpatient Medications   Medication Sig    cyanocobalamin (VITAMIN B 12) 1,000 mcg Oral Tablet Take 1,000 mcg by mouth Once a day    Ibuprofen (MOTRIN) 200 mg Oral Tablet Take 1 Tablet (200 mg total) by mouth Four times a day as needed for Pain    multivitamin Oral Tablet Take 1 Tab by mouth Once a day    simvastatin (ZOCOR) 20 mg Oral Tablet Take 1 Tablet (20 mg total) by mouth Every evening     Allergies   Allergen Reactions    Penicillins Hives/ Urticaria    Lipitor [Atorvastatin] Myalgia       Past Medical History:   Diagnosis Date    Abnormal serum cholesterol     Rosacea          Past Surgical History:   Procedure Laterality Date    HX ADENOIDECTOMY      HX CESAREAN SECTION      x2    HX ENDOMETRIAL ABLATION      HX HYSTERECTOMY  2017    complete    HX OOPHORECTOMY           Family Medical History:     Problem Relation (Age of Onset)    Breast Cancer Maternal Grandmother (96)    Cancer Father    Diabetes Maternal Grandfather    Heart Disease Mother (55)    Hypertension (High Blood Pressure) Mother, Father          Social History     Tobacco Use    Smoking status: Never    Smokeless tobacco: Never   Vaping Use    Vaping Use: Never used   Substance Use Topics    Alcohol use: Yes     Comment: Social     Drug use: No       Review of  Systems:     Constitutional Negative for: Fever, Chills, Weight Loss, Malaise/Fatigue, Weakness, Diaphoresis     Skin Negative for: Rash, Itching     HENT Negative for: Headaches, Hearing Loss, Tinnitus, Ear Pain, Ear Discharge, Nosebleeds, Congestion, Sore Throat, Stridor     Eyes Negative for: Blurred Vision, Double Vision, Photophobia, Eye Pain, Eye Discharge, Eye Redness     Cardiovascular Negative for: Chest Pain, Palpitations, Orthopnea, PND, Leg Swelling, Claudication     Respiratory Negative for: Cough, Hemoptysis, Sputum Production, Shortness of Breath, Wheezing     Gastrointestinal Negative for: Heartburn, Nausea, Vomiting, Abdominal Pain, Diarrhea, Constipation, Blood in Stool, Melena     Genitourinary Negative for: Dysuria, Urgency, Frequency, Hematuria, Flank Pain     Musculoskeletal  Negative for: Neck Pain, Back Pain, Joint Pain, Falls, Myalgias     Endo/Heme/Allergy Negative for: Easy Bruise/Bleed, Env Allergies, Polydipsia     Neurological Negative for : Dizziness, Tingling, Tremor, Sensory Change, Speech Change, Seizures, LOC, Focal Weakness     Psychological Negative for: Depression, Suicidal Ideas, Substance Abuse, Hallucinations, Nervoucs/Anxious, Insomnia, Memory Loss                          Objective:   Vitals: BP 134/88    Pulse 93    Temp 36 C (96.8 F)    Ht 1.626 m (5\' 4" )    Wt 88.9 kg (196 lb)    LMP 03/19/2015    SpO2 97%    BMI 33.64 kg/m         General Exam:    General:  appears in good health, no distress and vital signs reviewed  Lungs:  Clear to auscultation bilaterally.   Cardiovascular:  regular rate and rhythm     Vascular    +2  Extremities:  Mild ecchymosis to right leg   Skin:  Skin warm and dry and stab incisions healing well. No signs or symptoms of gross infection        ENCOUNTER DIAGNOSES     ICD-10-CM   1. Status post endovenous radiofrequency ablation (RFA) of saphenous vein  Z98.890   2. Venous insufficiency of both lower extremities   I87.2       Assessment     chronic venous insufficiency with Right     greater saphenous vein reflux  Stable status post  Right     greater saphenous vein endovenous ablation       Plan     Right    venous ultrasound will be scheduled at a later date. There is no ultrasound tech available in the office today   Remove sutures and apply steri-strips    Follow up for ultrasound   Will repeat ultrasound next visit  Thigh high support stockings 4-6 weeks, then at any level comfortable.       No orders of the defined types were placed in this encounter.      Return for ultrasound .    [place] Mekhai Venuto, APRN,NP-C    Patient was seen independently in clinic. The cosigning physician, Dr. Kallie Edward, was available for consultation.

## 2021-02-12 ENCOUNTER — Ambulatory Visit (INDEPENDENT_AMBULATORY_CARE_PROVIDER_SITE_OTHER): Payer: BC Managed Care – PPO

## 2021-02-12 DIAGNOSIS — Z9889 Other specified postprocedural states: Secondary | ICD-10-CM

## 2021-02-12 DIAGNOSIS — I872 Venous insufficiency (chronic) (peripheral): Secondary | ICD-10-CM

## 2021-02-12 NOTE — Progress Notes (Cosign Needed)
[facility]  [address]  [place] [address]  Phone: [phone]  Fax: [phone]      Encounter Date: 02/12/2021    Patient ID:  Erica Harper  OEV:[id number]    DOB: 1960/09/23  Age: 60 y.o. female    Subjective:   No chief complaint on file.      HPI  Erica Harper is a 60 y.o. female who was seen today for follow up after Right     leg greater saphenous vein endovenous ablationwith phlebectomy at 20 sites and sclerotherapy at 3 sites .  she presents with no complaints.      Current Outpatient Medications   Medication Sig    cyanocobalamin (VITAMIN B 12) 1,000 mcg Oral Tablet Take 1,000 mcg by mouth Once a day    Ibuprofen (MOTRIN) 200 mg Oral Tablet Take 1 Tablet (200 mg total) by mouth Four times a day as needed for Pain    multivitamin Oral Tablet Take 1 Tab by mouth Once a day    simvastatin (ZOCOR) 20 mg Oral Tablet Take 1 Tablet (20 mg total) by mouth Every evening     Allergies   Allergen Reactions    Penicillins Hives/ Urticaria    Lipitor [Atorvastatin] Myalgia       Past Medical History:   Diagnosis Date    Abnormal serum cholesterol     Rosacea          Past Surgical History:   Procedure Laterality Date    HX ADENOIDECTOMY      HX CESAREAN SECTION      x2    HX ENDOMETRIAL ABLATION      HX HYSTERECTOMY  2017    complete    HX OOPHORECTOMY           Family Medical History:     Problem Relation (Age of Onset)    Breast Cancer Maternal Grandmother (96)    Cancer Father    Diabetes Maternal Grandfather    Heart Disease Mother (44)    Hypertension (High Blood Pressure) Mother, Father          Social History     Tobacco Use    Smoking status: Never    Smokeless tobacco: Never   Vaping Use    Vaping Use: Never used   Substance Use Topics    Alcohol use: Yes     Comment: Social    Drug use: No       Review of  Systems:                                                                                                  Objective:   Vitals: LMP 03/19/2015          General Exam:    General:  appears in good health, no distress and vital signs reviewed  Lungs:  Clear to auscultation bilaterally.   Cardiovascular:  regular rate and rhythm     Vascular    +2  Extremities:  Mild ecchymosis to right leg  Skin:  Skin warm and dry and stab incisions healing well. No signs or symptoms of gross infection        ENCOUNTER DIAGNOSES     ICD-10-CM   1. Status post endovenous radiofrequency ablation (RFA) of saphenous vein  Z98.890   2. Venous insufficiency of both lower extremities  I87.2       Assessment     chronic venous insufficiency with Right     greater saphenous vein reflux  Stable status post  Right     greater saphenous vein endovenous ablation      Preliminary ultrasound result shows Right     greater saphenous vein is closed at the common femoral junction. No evidence of deep vein thrombosis.    Plan     Right    venous ultrasound in office today  Remove steri-strips    Follow up Tuesday at 8:20 a.m..  Will repeat ultrasound next visit  Thigh high support stockings 4-6 weeks, then at any level comfortable.       No orders of the defined types were placed in this encounter.      Return for Tuesday at 8:20 a.m. for ultrasound.    Liliana Cline, APRN,NP-C        Patient was seen independently in clinic. The cosigning physician, Dr. Kallie Edward, was available for consult on the care and management of this patient.  Liliana Cline, APRN,NP-C

## 2021-02-12 NOTE — Addendum Note (Signed)
Addended by: Talmage Nap on: 02/12/2021 01:45 PM     Modules accepted: Orders

## 2021-02-19 ENCOUNTER — Ambulatory Visit (INDEPENDENT_AMBULATORY_CARE_PROVIDER_SITE_OTHER): Payer: BC Managed Care – PPO | Admitting: Vascular & Interventional Radiology

## 2021-02-19 DIAGNOSIS — Z9889 Other specified postprocedural states: Secondary | ICD-10-CM

## 2021-02-19 NOTE — Progress Notes (Cosign Needed)
Patient here today for follow up GSV closure ultrasound. GSV measures 2.02cm away for SFJ junction. Continue to wear thigh high compression stockings for a total of 6 weeks. Follow up in 3 months for ultrasound.   Erica Prentice Sackrider, APRN,NP-C

## 2021-02-19 NOTE — Addendum Note (Signed)
Addended by: Talmage Nap on: 02/19/2021 03:39 PM     Modules accepted: Orders

## 2021-03-01 ENCOUNTER — Encounter (HOSPITAL_BASED_OUTPATIENT_CLINIC_OR_DEPARTMENT_OTHER): Payer: Self-pay | Admitting: Student in an Organized Health Care Education/Training Program

## 2021-03-01 ENCOUNTER — Ambulatory Visit
Payer: BC Managed Care – PPO | Attending: Student in an Organized Health Care Education/Training Program | Admitting: Student in an Organized Health Care Education/Training Program

## 2021-03-01 ENCOUNTER — Other Ambulatory Visit: Payer: Self-pay

## 2021-03-01 VITALS — BP 148/81 | HR 86 | Temp 97.0°F | Resp 16 | Ht 64.0 in | Wt 196.8 lb

## 2021-03-01 DIAGNOSIS — K7689 Other specified diseases of liver: Secondary | ICD-10-CM | POA: Insufficient documentation

## 2021-03-01 DIAGNOSIS — Z1211 Encounter for screening for malignant neoplasm of colon: Secondary | ICD-10-CM | POA: Insufficient documentation

## 2021-03-01 DIAGNOSIS — K824 Cholesterolosis of gallbladder: Secondary | ICD-10-CM | POA: Insufficient documentation

## 2021-03-01 DIAGNOSIS — Z791 Long term (current) use of non-steroidal anti-inflammatories (NSAID): Secondary | ICD-10-CM | POA: Insufficient documentation

## 2021-03-01 DIAGNOSIS — E669 Obesity, unspecified: Secondary | ICD-10-CM

## 2021-03-01 DIAGNOSIS — K5790 Diverticulosis of intestine, part unspecified, without perforation or abscess without bleeding: Secondary | ICD-10-CM

## 2021-03-01 NOTE — Nursing Note (Signed)
Patient here as a new patient for hepatic cyst

## 2021-03-01 NOTE — Progress Notes (Unsigned)
[facility]                                                      Department of Gastroenterology  [place], [place] [address]    Date:   03/01/2021  Name: Erica Harper  Age: 61 y.o.    Referring Provider:    [name], MD  [address]  [place],  [place] [address]    Primary Care Provider:    [name], MD    Chief Complaint: liver cyst, CRC screening, hx of  GB polyp     History of Present Illness  The history is obtained from the patient and chart reveiw  Erica Harper is a pleasant 61 y.o. female with past medical history of GB polyp, NSAID use, obesity, liver cysts    Clinic visit 03-01-21  ===============  Pt is here for new visit   No smoking or drinking   Last colonosocop 5 yr was with fair prep, diverticulosis seen, recommended to repeat after 5 yrs   No FH of liver disease, cancer or GI cancer    taking ibuprofen as needed   Had liver US in 2017 cyst size 3.7cm , last [place] 12/2020 cyst size 4.8 cm   in 2017 GB polyps seen up to 73mm, last [place] did not show polyps or stones     The patient denies any heartburn, difficulty with swallowing, abdominal pain, nausea, vomiting, abdominal bloating, constipation, diarrhea, rectal bleeding, melena, hematemesis, unintentional weight loss or other complaints.        Review of Systems  See HPI.  No chest pain or sob  No fever or chills     Past Medical History: She  has a past medical history of Abnormal serum cholesterol and Rosacea.    She has no past medical history of Breast CA (CMS HCC), Cancer (CMS HCC), Cervical cancer (CMS [place]), Colon cancer (CMS [place]), Endometrial cancer (CMS [place]), Ovarian cancer (CMS [place]), Treatment, or Uterine cancer (CMS [place]).      Past Surgical History: She  has a past surgical history that includes hx cesarean section; hx adenoidectomy; hx endometrial ablation; hx hysterectomy (2017); and hx oophorectomy.    Social History: Her  reports that she has never smoked. She has never used smokeless tobacco. She reports  current alcohol use. She reports that she does not use drugs., ,     Family History: She family history includes Breast Cancer (age of onset: 34) in her maternal grandmother; Cancer in her father; Diabetes in her maternal grandfather; Heart Disease (age of onset: 56) in her mother; Hypertension (High Blood Pressure) in her father and mother.    Allergies: She is allergic to penicillins and lipitor [atorvastatin].    Home Medication:   Current Outpatient Medications   Medication Sig   . cyanocobalamin (VITAMIN B 12) 1,000 mcg Oral Tablet Take 1,000 mcg by mouth Once a day   . Ibuprofen (MOTRIN) 200 mg Oral Tablet Take 1 Tablet (200 mg total) by mouth Four times a day as needed for Pain   . multivitamin Oral Tablet Take 1 Tab by mouth Once a day   . simvastatin (ZOCOR) 20 mg Oral Tablet Take 1 Tablet (20 mg total) by mouth Every evening       Examination:  LMP 03/19/2015        Wt Readings from Last 3 Encounters:   02/04/21 88.9 kg (196 lb)   02/01/21 88.9 kg (195 lb 15.8 oz)   01/25/21 88.9 kg (196 lb)       General: Resting comfortably, no acute distress.  Eyes: Conjunctiva normal, sclera non-icteric.  HENT: Atraumatic and normocephalic.   Neck:  Supple.   Lungs: Breathing comfortably.  No respiratory distress.  Extremities: No cyanosis or edema  Neurologic: Alert, Oriented, Grossly normal  Psychiatric: Appears normal    Data/Chart/Labs/Imaging reviewed:  Colonoscopy with fair prep by Dr Rowe Clack 08/2016  - The perianal and digital rectal examinations were normal.  - A few small-mouthed diverticula were found in the sigmoid colon.  - The exam was otherwise without abnormality on direct and retroflexion views.    Recommended to repeat after 5 yrs     Liver US in 2017   IMPRESSION:  1. Gallbladder polyps measuring up to 5 mm.  2. 3.7 cm right hepatic lobe cyst is likely benign.      Liver US 12/2020  FINDINGS: Transabdominal right upper quadrant ultrasound shows a 4.8 cm right hepatic lobe cyst and a 9 mm simple cyst  left hepatic lobe. There is no intrahepatic biliary duct dilatation. Pancreas is unremarkable. No cholelithiasis or gallbladder wall thickening is seen. Common bile duct measures 2 mm. Right kidney shows no stones or hydronephrosis.    IMPRESSION:  1. Confirmation multiple hepatic cysts.  2. No cholelithiasis.    CT chest 04/2020   -No PE   -There is a 3 cm low-density focus in the right hepatic lobe which is partially visualized. This may be a cyst or hemangioma. Recommend ultrasound follow-up to confirm. At least 2 additional low-density lesions are noted in the right hepatic lobe    CBC 12/2020: normal   INR 12/2020: normal   CMP 12/2020: normal   ________________________________________________  Assessment and Plan  Erica Harper is a 61 y.o. female with the following issues:    1. Bilateral lobe liver simple cysts, up to 4.8 cm. Cyst size was 3.7 in 2017.   2. Hx of Gallbladder polyps up to 5 mm in 2017, follow up US did not show any polyps or stones. Patient is asymptomatic    3. CRC screening, surveillance colonoscopy due 08/2021   4. Uncomplicated colonic diverticulosis   5. Obesity with BMI 33.8   6. Long term NSAIDs use       My recommendations are as follows:    -I personally reviewed previous GI workup/procedures/labs and relevant imaging  -Due to enlargement of liver cysts compared to 2017, and inconsistent reports about GB polyps, will order Abdomen MRI to further evaluate.   -Check AFP  -check viral hepatitis panel  -check for hepatitis A/B immunity, vaccinate if not immune   -Schedule colonoscopy next visit, colonoscopy due 08/2021   -Avoid NSAIDs   -I have counseled the patient on health risks with high BMI and strategies for weight loss including regular measurement of weight, calorie restriction, portion control, increased aerobic exercise and regular walking. Patient instructed to loose at least 10 % of the current weight   -Avoid alcohol intake   -Recommended to eat a high-fiber diet with fresh  fruits and vegetables.  Suggested to increase fluid intake and to drink more water when eating more fiber.  Also suggested regular exercise.        Return to the clinic in 6 months  Clenton Pare, [facility]  [place], [place]  [id number]    Note: A portion of this documentation was generated using MMODAL (voice recognition software) and may contain syntax/voice recognition errors.

## 2021-03-03 DIAGNOSIS — K824 Cholesterolosis of gallbladder: Secondary | ICD-10-CM | POA: Insufficient documentation

## 2021-03-03 DIAGNOSIS — E669 Obesity, unspecified: Secondary | ICD-10-CM | POA: Insufficient documentation

## 2021-03-03 DIAGNOSIS — K7689 Other specified diseases of liver: Secondary | ICD-10-CM | POA: Insufficient documentation

## 2021-03-03 DIAGNOSIS — Z791 Long term (current) use of non-steroidal anti-inflammatories (NSAID): Secondary | ICD-10-CM | POA: Insufficient documentation

## 2021-03-03 DIAGNOSIS — Z1211 Encounter for screening for malignant neoplasm of colon: Secondary | ICD-10-CM | POA: Insufficient documentation

## 2021-03-03 DIAGNOSIS — K5790 Diverticulosis of intestine, part unspecified, without perforation or abscess without bleeding: Secondary | ICD-10-CM | POA: Insufficient documentation

## 2021-03-04 ENCOUNTER — Encounter (HOSPITAL_BASED_OUTPATIENT_CLINIC_OR_DEPARTMENT_OTHER): Payer: Self-pay | Admitting: Student in an Organized Health Care Education/Training Program

## 2021-03-11 ENCOUNTER — Telehealth (HOSPITAL_BASED_OUTPATIENT_CLINIC_OR_DEPARTMENT_OTHER): Payer: Self-pay | Admitting: Student in an Organized Health Care Education/Training Program

## 2021-03-11 NOTE — Telephone Encounter (Signed)
I spoke with patient RE: MRI ABD MRCP scheduled 03/31/21@7AM  with NPO after midnight prior.  Patient answered no to MRI questions and does not need asst walking.  Patient expressed understanding.  03/11/21@3 :27Pm sra

## 2021-03-31 ENCOUNTER — Other Ambulatory Visit: Payer: Self-pay

## 2021-03-31 ENCOUNTER — Inpatient Hospital Stay
Admission: RE | Admit: 2021-03-31 | Discharge: 2021-03-31 | Disposition: A | Discharge status: Home / Self Care | Payer: BC Managed Care – PPO | Source: Ambulatory Visit | Attending: Student in an Organized Health Care Education/Training Program | Admitting: Student in an Organized Health Care Education/Training Program

## 2021-03-31 ENCOUNTER — Other Ambulatory Visit (HOSPITAL_COMMUNITY): Payer: BC Managed Care – PPO

## 2021-03-31 DIAGNOSIS — K824 Cholesterolosis of gallbladder: Secondary | ICD-10-CM | POA: Insufficient documentation

## 2021-03-31 DIAGNOSIS — K7689 Other specified diseases of liver: Secondary | ICD-10-CM

## 2021-03-31 LAB — HEPATITIS B CORE ANTIBODY: HBV CORE TOTAL ANTIBODIES: NEGATIVE

## 2021-03-31 LAB — HEPATITIS C ANTIBODY SCREEN WITH REFLEX TO HCV PCR: HCV ANTIBODY QUALITATIVE: NEGATIVE

## 2021-03-31 LAB — HEPATITIS B SURFACE ANTIBODY: HBV SURFACE ANTIBODY QUANTITATIVE: 3 m[IU]/mL (ref <8)

## 2021-03-31 LAB — HEPATITIS B CORE IGM, AB: HBV CORE IGM ANTIBODY QUALITATIVE: NEGATIVE

## 2021-03-31 LAB — HEPATITIS B SURFACE ANTIGEN: HBV SURFACE ANTIGEN QUALITATIVE: NEGATIVE

## 2021-03-31 LAB — HEPATITIS A (HAV) IGG ANTIBODY: HAV IGG ANTIBODY: NEGATIVE

## 2021-03-31 LAB — ALPHA FETOPROTEIN (AFP) TUMOR MARKER: AFP TUMOR MARKER: 2 ng/mL (ref <=9)

## 2021-03-31 MED ORDER — GADOBENATE DIMEGLUMINE 529 MG/ML(0.1 MMOL/0.2 ML) INTRAVENOUS SOLUTION
10.0000 mL | INTRAVENOUS | Status: AC
Start: 2021-03-31 — End: 2021-03-31
  Administered 2021-03-31: 10 mL via INTRAVENOUS

## 2021-04-02 ENCOUNTER — Encounter (HOSPITAL_BASED_OUTPATIENT_CLINIC_OR_DEPARTMENT_OTHER): Payer: Self-pay

## 2021-04-18 ENCOUNTER — Ambulatory Visit (HOSPITAL_COMMUNITY): Payer: Self-pay

## 2021-04-23 ENCOUNTER — Telehealth (INDEPENDENT_AMBULATORY_CARE_PROVIDER_SITE_OTHER): Payer: Self-pay | Admitting: Family Medicine

## 2021-04-23 ENCOUNTER — Other Ambulatory Visit: Payer: BC Managed Care – PPO | Attending: Family Medicine | Admitting: Family Medicine

## 2021-04-23 DIAGNOSIS — N390 Urinary tract infection, site not specified: Secondary | ICD-10-CM | POA: Insufficient documentation

## 2021-04-23 LAB — URINALYSIS, MACRO/MICRO
BILIRUBIN: NEGATIVE mg/dL
COLOR: NORMAL
GLUCOSE: NORMAL mg/dL
KETONES: NEGATIVE mg/dL
LEUKOCYTES: 500 WBCs/uL — AB
NITRITE: NEGATIVE
PH: 6 (ref 5.0–8.0)
PROTEIN: 30 mg/dL — AB
SPECIFIC GRAVITY: 1.005 (ref 1.005–1.030)
UROBILINOGEN: NORMAL mg/dL
WBCS: >100 /hpf — AB

## 2021-04-23 NOTE — Telephone Encounter (Signed)
She needs to schedule an appt tomorrow.

## 2021-04-23 NOTE — Telephone Encounter (Signed)
Urine dip shows blood and leukocytes.

## 2021-04-23 NOTE — Telephone Encounter (Addendum)
Pt is here in office bc she thinks she has a UTI. She has frequent urination and blood in urine. Started today. Feels like she needs to go all the time. Pt uses [facility]  in [place] by [address].      [phone]

## 2021-04-24 ENCOUNTER — Other Ambulatory Visit: Payer: BC Managed Care – PPO | Attending: Family Medicine | Admitting: Family Medicine

## 2021-04-24 ENCOUNTER — Ambulatory Visit (INDEPENDENT_AMBULATORY_CARE_PROVIDER_SITE_OTHER): Payer: BC Managed Care – PPO | Admitting: Family Medicine

## 2021-04-24 ENCOUNTER — Encounter (INDEPENDENT_AMBULATORY_CARE_PROVIDER_SITE_OTHER): Payer: Self-pay | Admitting: Family Medicine

## 2021-04-24 VITALS — BP 110/62 | HR 82 | Temp 97.3°F | Resp 16 | Ht 64.0 in | Wt 193.6 lb

## 2021-04-24 DIAGNOSIS — E785 Hyperlipidemia, unspecified: Secondary | ICD-10-CM | POA: Insufficient documentation

## 2021-04-24 DIAGNOSIS — E538 Deficiency of other specified B group vitamins: Secondary | ICD-10-CM | POA: Insufficient documentation

## 2021-04-24 DIAGNOSIS — E559 Vitamin D deficiency, unspecified: Secondary | ICD-10-CM | POA: Insufficient documentation

## 2021-04-24 DIAGNOSIS — B3731 Acute candidiasis of vulva and vagina: Secondary | ICD-10-CM

## 2021-04-24 DIAGNOSIS — N952 Postmenopausal atrophic vaginitis: Secondary | ICD-10-CM

## 2021-04-24 DIAGNOSIS — R300 Dysuria: Secondary | ICD-10-CM

## 2021-04-24 DIAGNOSIS — I872 Venous insufficiency (chronic) (peripheral): Secondary | ICD-10-CM

## 2021-04-24 LAB — CBC WITH DIFF
BASOPHIL #: <0.1 10*3/uL (ref <=0.20)
BASOPHIL %: 1 %
EOSINOPHIL #: 0.14 10*3/uL (ref <=0.50)
EOSINOPHIL %: 2 %
HCT: 43.2 % (ref 34.8–46.0)
HGB: 14 g/dL (ref 11.5–16.0)
IMMATURE GRANULOCYTE #: <0.1 10*3/uL (ref <0.10)
IMMATURE GRANULOCYTE %: 0 % (ref 0–1)
LYMPHOCYTE #: 1.49 10*3/uL (ref 1.00–4.80)
LYMPHOCYTE %: 19 %
MCH: 29.2 pg (ref 26.0–32.0)
MCHC: 32.4 g/dL (ref 31.0–35.5)
MCV: 90 fL (ref 78.0–100.0)
MONOCYTE #: 0.49 10*3/uL (ref 0.20–1.10)
MONOCYTE %: 6 %
MPV: 10.8 fL (ref 8.7–12.5)
NEUTROPHIL #: 5.55 10*3/uL (ref 1.50–7.70)
NEUTROPHIL %: 72 %
PLATELETS: 288 10*3/uL (ref 150–400)
RBC: 4.8 10*6/uL (ref 3.85–5.22)
RDW-CV: 14.3 % (ref 11.5–15.5)
WBC: 7.7 10*3/uL (ref 3.7–11.0)

## 2021-04-24 LAB — LIPID PANEL
CHOL/HDL RATIO: 2.3
CHOLESTEROL: 184 mg/dL (ref 100–200)
HDL CHOL: 79 mg/dL (ref >=50)
LDL CALC: 96 mg/dL (ref <100)
NON-HDL: 105 mg/dL (ref <=190)
TRIGLYCERIDES: 43 mg/dL (ref <150)
VLDL CALC: 7 mg/dL (ref <30)

## 2021-04-24 LAB — COMPREHENSIVE METABOLIC PANEL, NON-FASTING
ALBUMIN: 4 g/dL (ref 3.5–5.0)
ALKALINE PHOSPHATASE: 88 U/L (ref 50–130)
ALT (SGPT): 33 U/L — ABNORMAL HIGH (ref 8–22)
ANION GAP: 9 mmol/L (ref 4–13)
AST (SGOT): 26 U/L (ref 8–45)
BILIRUBIN TOTAL: 0.5 mg/dL (ref 0.3–1.3)
BUN/CREA RATIO: 14 (ref 6–22)
BUN: 12 mg/dL (ref 8–25)
CALCIUM: 9.6 mg/dL (ref 8.5–10.0)
CHLORIDE: 106 mmol/L (ref 96–111)
CO2 TOTAL: 26 mmol/L (ref 22–30)
CREATININE: 0.87 mg/dL (ref 0.60–1.05)
ESTIMATED GFR: 77 mL/min/BSA (ref >=60)
GLUCOSE: 87 mg/dL (ref 65–125)
POTASSIUM: 4.4 mmol/L (ref 3.5–5.1)
PROTEIN TOTAL: 7.6 g/dL (ref 6.4–8.3)
SODIUM: 141 mmol/L (ref 136–145)

## 2021-04-24 LAB — VITAMIN D 25 TOTAL: VITAMIN D 25, TOTAL: 24.7 ng/mL — ABNORMAL LOW (ref 30.0–100.0)

## 2021-04-24 LAB — CREATINE KINASE (CK), TOTAL, SERUM: CREATINE KINASE: 111 U/L (ref 25–190)

## 2021-04-24 LAB — VITAMIN B12: VITAMIN B 12: 515 pg/mL (ref 200–900)

## 2021-04-24 MED ORDER — FLUCONAZOLE 150 MG TABLET
ORAL_TABLET | ORAL | 0 refills | Status: DC
Start: 2021-04-24 — End: 2021-11-14

## 2021-04-24 MED ORDER — ESTRADIOL 0.01% (0.1 MG/GRAM) VAGINAL CREAM
TOPICAL_CREAM | VAGINAL | 1 refills | Status: DC
Start: 2021-04-24 — End: 2021-11-14

## 2021-04-24 NOTE — Progress Notes (Signed)
[facility]  [place] [address]        Encounter Date: 04/24/2021  9:30 AM EST      Name: Erica Harper  Age: 61 y.o.  DOB: 09/23/1960  Sex: female    Chief Complaint:   Chief Complaint   Patient presents with   . Urinary Tract Infection       HPI   Patient is here for a possible bladder infection.She has burning after she urinates.  She has not noticed vaginal discharge.  She has had increased urinary frequency.  She had a UA yesterday that showed yeast. No recent antibiotic use.    She has recurrent vaginal dryness for several years and  has well tolerated estrogen cream in the past.    History   Family Medical History:     Problem Relation (Age of Onset)    Breast Cancer Maternal Grandmother (96)    Cancer Father    Diabetes Maternal Grandfather    Heart Disease Mother (4)    Hypertension (High Blood Pressure) Mother, Father        Past Medical History:   Diagnosis Date   . Rosacea      Past Surgical History:   Procedure Laterality Date   . HX ADENOIDECTOMY     . HX CESAREAN SECTION      x2   . HX ENDOMETRIAL ABLATION     . HX HYSTERECTOMY  2017    complete   . HX OOPHORECTOMY       Patient Active Problem List    Diagnosis   . Vitamin B12 deficiency   . Vitamin D deficiency   . Hepatic cyst   . Gallbladder polyp   . Obesity (BMI 30-39.9)   . NSAID long-term use   . Diverticulosis   . Venous insufficiency of both lower extremities   . Hyperlipidemia LDL goal <100   . Atrophic vaginitis     Current Outpatient Medications   Medication Sig   . cyanocobalamin (VITAMIN B 12) 1,000 mcg Oral Tablet Take 1 Tablet (1,000 mcg total) by mouth Once a day (Patient not taking: Reported on 04/24/2021)   . estradioL (ESTRACE) 0.01 % (0.1 mg/gram) Vaginal Cream Insert 0.5gm into vagina qhs x 2 weeks and then twice a week as needed   . fluconazole (DIFLUCAN) 150 mg Oral Tablet Take one tablet today and repeat in two days   . Ibuprofen (MOTRIN) 200 mg Oral Tablet Take 1 Tablet (200 mg total) by mouth  Four times a day as needed for Pain   . multivitamin Oral Tablet Take 1 Tablet by mouth Once a day   . simvastatin (ZOCOR) 20 mg Oral Tablet Take 1 Tablet (20 mg total) by mouth Every evening     Social History     Tobacco Use   Smoking Status Never   Smokeless Tobacco Never     Social History     Substance and Sexual Activity   Alcohol Use Yes    Comment: Social     Social History     Substance and Sexual Activity   Drug Use No       Review of Systems   Constitutional: Negative for chills and fever.   HENT: Negative for sinus pressure and sore throat.    Eyes: Negative for visual disturbance.   Respiratory: Negative for cough and shortness of breath.    Cardiovascular: Negative for chest pain and palpitations.   Gastrointestinal:  Negative for abdominal pain, constipation and diarrhea.   Endocrine: Negative for cold intolerance and heat intolerance.   Genitourinary: Negative for pelvic pain and vaginal bleeding.   Musculoskeletal: Negative for gait problem.        Patient does wear compression stockings to help with her chronic lower extremity edema.    She is well tolerating the Zocor and has not developed unusual muscle aches   Skin: Negative for rash.   Allergic/Immunologic:        No history of recurrent bladder infection   Neurological: Negative for dizziness and weakness.   Hematological: Does not bruise/bleed easily.   Psychiatric/Behavioral: Negative for dysphoric mood. The patient is not nervous/anxious.         Patient enjoys spending time with her 70-monthold granddaughter        Examination  Vitals: BP 110/62   Pulse 82   Temp 36.3 C (97.3 F)   Resp 16   Ht 1.626 m ('5\' 4"'$ )   Wt 87.8 kg (193 lb 9.6 oz)   LMP 03/19/2015   SpO2 98%   BMI 33.23 kg/m         Physical Exam  Vitals and nursing note reviewed.   Constitutional:       General: She is not in acute distress.     Appearance: Normal appearance. She is well-developed.   HENT:      Head: Normocephalic and atraumatic.      Nose: Nose normal.       Mouth/Throat:      Mouth: Mucous membranes are moist.      Pharynx: Oropharynx is clear.   Eyes:      Extraocular Movements: Extraocular movements intact.      Conjunctiva/sclera: Conjunctivae normal.      Pupils: Pupils are equal, round, and reactive to light.   Cardiovascular:      Rate and Rhythm: Normal rate and regular rhythm.   Pulmonary:      Effort: Pulmonary effort is normal. No respiratory distress.      Breath sounds: Normal breath sounds. No wheezing.   Abdominal:      General: Bowel sounds are normal. There is no distension.      Palpations: Abdomen is soft.      Tenderness: There is no abdominal tenderness.   Genitourinary:     Vagina: Vaginal discharge present.      Comments: Patient have white thick vaginal discharge.  Patient has dry atrophic vaginal tissue.  Cervix and uterus were surgically absent  Musculoskeletal:      Cervical back: Normal range of motion and neck supple.      Right lower leg: No edema.      Left lower leg: No edema.      Comments: No CVA tenderness bilaterally normal gait   Skin:     General: Skin is warm and dry.      Comments: Well healed scars on her right quadriceps   Neurological:      Mental Status: She is alert and oriented to person, place, and time.      Cranial Nerves: No cranial nerve deficit.      Coordination: Coordination normal.   Psychiatric:         Mood and Affect: Mood normal.         Behavior: Behavior normal.         Thought Content: Thought content normal.         Judgment: Judgment normal.  Assessment and Plan  Erica Harper was seen today for urinary tract infection.    Vaginal candidiasis  She was given the following medication.  -     fluconazole (DIFLUCAN) 150 mg Oral Tablet; Take one tablet today and repeat in two days    Atrophic vaginitis  Patient is going to restart the estrogen cream.  -     estradioL (ESTRACE) 0.01 % (0.1 mg/gram) Vaginal Cream; Insert 0.5gm into vagina qhs x 2 weeks and then twice a week as needed    Dysuria  UA showed  yeast.  Cultures currently pending    Hyperlipidemia LDL goal <100  She is well tolerating the Zocor  -     COMPREHENSIVE METABOLIC PANEL, NON-FASTING; Future  -     CBC/DIFF; Future  -     LIPID PANEL; Future  -     CREATINE KINASE (CK), TOTAL, SERUM; Future    Venous insufficiency of both lower extremities  Continue compression stockings    Vitamin B12 deficiency  Patient requested her vitamin B12 level be checked  -     VITAMIN B12; Future    Vitamin D deficiency  Patient requested her vitamin-D level be checked  -     VITAMIN D 25 TOTAL; Future    She had labs drawn in clinic    Return for follow up as scheduled.    [name], MD

## 2021-04-25 ENCOUNTER — Other Ambulatory Visit (INDEPENDENT_AMBULATORY_CARE_PROVIDER_SITE_OTHER): Payer: Self-pay | Admitting: Family Medicine

## 2021-04-25 ENCOUNTER — Telehealth (INDEPENDENT_AMBULATORY_CARE_PROVIDER_SITE_OTHER): Payer: Self-pay | Admitting: Family Medicine

## 2021-04-25 MED ORDER — ERGOCALCIFEROL (VITAMIN D2) 1,250 MCG (50,000 UNIT) CAPSULE
50000.0000 [IU] | ORAL_CAPSULE | ORAL | 1 refills | Status: DC
Start: 2021-04-25 — End: 2021-11-14

## 2021-04-25 NOTE — Telephone Encounter (Signed)
Pt.notified

## 2021-04-25 NOTE — Telephone Encounter (Signed)
-----   Message from [name], MD sent at 04/24/2021  7:27 PM EST -----  1. CBC and chem 14 are grossly WNL  2. Vitamin D is low at 24. She can take Vitamin D 50,000 units po q wk x 6 months  3. Vitamin B12 is WNL  4. Cholesterol panel looks great. Continue the Zocor

## 2021-04-26 ENCOUNTER — Other Ambulatory Visit (INDEPENDENT_AMBULATORY_CARE_PROVIDER_SITE_OTHER): Payer: Self-pay | Admitting: Family Medicine

## 2021-04-26 LAB — URINE CULTURE,ROUTINE: URINE CULTURE: 40000 — AB

## 2021-04-26 MED ORDER — NITROFURANTOIN MONOHYDRATE/MACROCRYSTALS 100 MG CAPSULE
100.0000 mg | ORAL_CAPSULE | Freq: Two times a day (BID) | ORAL | 0 refills | Status: AC
Start: 2021-04-26 — End: 2021-05-03

## 2021-04-26 NOTE — Telephone Encounter (Signed)
Pt notified and she still having frequency and urgency feeling.

## 2021-04-26 NOTE — Telephone Encounter (Signed)
-----   Message from [name], MD sent at 04/26/2021  8:18 AM EST -----  Patient only had 40,000 count of bacteria and I usually treat when the count is above 100,000. However, if she still has symptoms, then I can send her script for Macrobid one po bid  x 7d to her pharmacy.

## 2021-05-20 ENCOUNTER — Ambulatory Visit (INDEPENDENT_AMBULATORY_CARE_PROVIDER_SITE_OTHER): Payer: BC Managed Care – PPO | Admitting: Primary Care

## 2021-05-20 ENCOUNTER — Telehealth (INDEPENDENT_AMBULATORY_CARE_PROVIDER_SITE_OTHER): Payer: Self-pay | Admitting: Family Medicine

## 2021-05-20 ENCOUNTER — Encounter (INDEPENDENT_AMBULATORY_CARE_PROVIDER_SITE_OTHER): Payer: Self-pay | Admitting: Vascular & Interventional Radiology

## 2021-05-20 VITALS — BP 120/70 | HR 74 | Temp 95.9°F | Ht 64.0 in | Wt 197.7 lb

## 2021-05-20 DIAGNOSIS — Z9889 Other specified postprocedural states: Secondary | ICD-10-CM

## 2021-05-20 DIAGNOSIS — I872 Venous insufficiency (chronic) (peripheral): Secondary | ICD-10-CM

## 2021-05-20 NOTE — Telephone Encounter (Signed)
Patient is calling in stating that the vitamin D panel she had in October was not covered under her in. She stated that is the code was changed to vitamin D deforciant it would be covered.     [phone]

## 2021-05-20 NOTE — Progress Notes (Signed)
[facility]  [address]  [place] [address]  Phone: [phone]  Fax: [phone]      Encounter Date: 05/20/2021    Patient ID:  Erica Harper  SJG:[id number]    DOB: 11-20-1960  Age: 61 y.o. female    Subjective:     Chief Complaint   Patient presents with   . Other     Pt in room 5 presents for a f/u gsv        HPI  Erica Harper is a 61 y.o. female who was seen today for three months follow up after Right leg greater saphenous vein endovenous ablation.  she presents with some improvement in symptoms.  She is complaining of a "pinching sensation" to the posterior thigh.  As well as a "funny sensation" to her pretibial region.  She is compliant with her compression stockings.    Current Outpatient Medications   Medication Sig   . cyanocobalamin (VITAMIN B 12) 1,000 mcg Oral Tablet Take 1 Tablet (1,000 mcg total) by mouth Once a day (Patient not taking: Reported on 04/24/2021)   . ergocalciferol, vitamin D2, (DRISDOL) 1,250 mcg (50,000 unit) Oral Capsule Take 1 Capsule (50,000 Units total) by mouth Every 7 days   . estradioL (ESTRACE) 0.01 % (0.1 mg/gram) Vaginal Cream Insert 0.5gm into vagina qhs x 2 weeks and then twice a week as needed   . fluconazole (DIFLUCAN) 150 mg Oral Tablet Take one tablet today and repeat in two days   . Ibuprofen (MOTRIN) 200 mg Oral Tablet Take 1 Tablet (200 mg total) by mouth Four times a day as needed for Pain   . multivitamin Oral Tablet Take 1 Tablet by mouth Once a day   . simvastatin (ZOCOR) 20 mg Oral Tablet Take 1 Tablet (20 mg total) by mouth Every evening     Allergies   Allergen Reactions   . Penicillins Hives/ Urticaria   . Lipitor [Atorvastatin] Myalgia       Past Medical History:   Diagnosis Date   . Rosacea          Past Surgical History:   Procedure Laterality Date   . HX ADENOIDECTOMY     . HX CESAREAN SECTION      x2   . HX ENDOMETRIAL ABLATION     . HX HYSTERECTOMY  2017    complete   . HX OOPHORECTOMY           Family Medical  History:     Problem Relation (Age of Onset)    Breast Cancer Maternal Grandmother (96)    Cancer Father    Diabetes Maternal Grandfather    Heart Disease Mother (6)    Hypertension (High Blood Pressure) Mother, Father          Social History     Tobacco Use   . Smoking status: Never   . Smokeless tobacco: Never   Vaping Use   . Vaping Use: Never used   Substance Use Topics   . Alcohol use: Yes     Comment: Social   . Drug use: No       Review of  Systems:     Constitutional Negative for: Fever, Chills, Weight Loss, Malaise/Fatigue, Diaphoresis, Weakness     Skin Negative for: Rash, Itching     HENT Negative for: Headaches, Hearing Loss, Ear Discharge, Ear Pain, Tinnitus, Nosebleeds, Congestion, Stridor, Sore Throat     Eyes Negative for:  Eye Redness, Eye Discharge, Eye Pain, Photophobia, Double Vision, Blurred Vision     Cardiovascular Negative for: Chest Pain, Palpitations, Orthopnea, Claudication, Leg Swelling, PND     Respiratory Negative for: Wheezing, Shortness of Breath, Sputum Production, Cough, Hemoptysis     Gastrointestinal Negative for: Heartburn, Nausea, Vomiting, Abdominal Pain, Diarrhea, Constipation, Blood in Stool, Melena     Genitourinary Negative for: Flank Pain, Hematuria, Frequency, Urgency, Dysuria     Musculoskeletal Negative for: Neck Pain, Back Pain, Joint Pain, Falls, Myalgias     Endo/Heme/Allergy Negative for: Easy Bruise/Bleed, Env Allergies, Polydipsia     Neurological Negative for : Dizziness, Tingling, Tremor, Speech Change, Sensory Change, Focal Weakness, LOC, Seizures     Psychological Negative for: Depression, Suicidal Ideas, Substance Abuse, Hallucinations, Nervoucs/Anxious, Insomnia, Memory Loss                          Objective:   Vitals: BP 120/70   Pulse 74   Temp 35.5 C (95.9 F)   Ht 1.626 m ('5\' 4"'$ )   Wt 89.7 kg (197 lb 11.2 oz)   LMP 03/19/2015   SpO2 95%   BMI 33.94 kg/m         General Exam:    General:  appears in good health, no distress and vital signs  reviewed  Lungs:  Clear to auscultation bilaterally.   Cardiovascular:  regular rate and rhythm     Vascular    +2 pulses  Extremities:  Mild lower extremity edema  Skin:  Skin warm and dry and no lesions.        ENCOUNTER DIAGNOSES     ICD-10-CM   1. Status post endovenous radiofrequency ablation (RFA) of saphenous vein  Z98.890   2. Venous insufficiency of both lower extremities  I87.2       Assessment     chronic venous insufficiency with Right greater saphenous vein reflux  Stable status post  Right greater saphenous vein endovenous ablation     Preliminary ultrasound result shows Bilateral greater saphenous vein is Closed   .  Low probability of deep vein thrombosis.  varicosities noted where patient was complaining of "funny" sensations     Plan     Right venous ultrasound in office today      Follow up in 1 year      Will continue conservative management with support stockings, leg elevation, and NSAIDs for pain relief.       Orders Placed This Encounter   . [place] W/ RESPONSES TO COMPRESSION; UNILAT/LIMITED STUDY (AMB ONLY)       Return in about 1 year (around 05/21/2022).    [place] Merriam Brandner, APRN,NP-C      Patient was seen independently in clinic. The cosigning physician, Dr. Kallie Edward, was available for consultation.

## 2021-05-20 NOTE — Telephone Encounter (Signed)
Theres nothing in the chart that this patient had a vitamin d level in October.

## 2021-05-22 ENCOUNTER — Other Ambulatory Visit (INDEPENDENT_AMBULATORY_CARE_PROVIDER_SITE_OTHER): Payer: Self-pay | Admitting: Family Medicine

## 2021-06-30 ENCOUNTER — Inpatient Hospital Stay
Admission: RE | Admit: 2021-06-30 | Discharge: 2021-06-30 | Disposition: A | Discharge status: Home / Self Care | Payer: BC Managed Care – PPO | Source: Ambulatory Visit | Attending: Family Medicine | Admitting: Family Medicine

## 2021-06-30 ENCOUNTER — Encounter (HOSPITAL_COMMUNITY): Payer: Self-pay

## 2021-06-30 ENCOUNTER — Other Ambulatory Visit: Payer: Self-pay

## 2021-06-30 DIAGNOSIS — Z1231 Encounter for screening mammogram for malignant neoplasm of breast: Secondary | ICD-10-CM | POA: Insufficient documentation

## 2021-07-01 ENCOUNTER — Telehealth (INDEPENDENT_AMBULATORY_CARE_PROVIDER_SITE_OTHER): Payer: Self-pay | Admitting: Family Medicine

## 2021-07-01 NOTE — Telephone Encounter (Signed)
-----   Message from [name], MD sent at 07/01/2021  9:40 AM EDT -----  Mammogram did not show any abnormality

## 2021-07-01 NOTE — Telephone Encounter (Signed)
Pt.notified

## 2021-07-30 ENCOUNTER — Other Ambulatory Visit (INDEPENDENT_AMBULATORY_CARE_PROVIDER_SITE_OTHER): Payer: Self-pay | Admitting: Family Medicine

## 2021-09-12 ENCOUNTER — Other Ambulatory Visit: Payer: Self-pay

## 2021-09-12 ENCOUNTER — Encounter (HOSPITAL_BASED_OUTPATIENT_CLINIC_OR_DEPARTMENT_OTHER): Payer: Self-pay | Admitting: Student in an Organized Health Care Education/Training Program

## 2021-09-12 ENCOUNTER — Ambulatory Visit: Payer: BC Managed Care – PPO | Admitting: Student in an Organized Health Care Education/Training Program

## 2021-09-12 VITALS — BP 153/78 | HR 83 | Temp 97.0°F | Resp 16 | Ht 64.0 in | Wt 207.2 lb

## 2021-09-12 DIAGNOSIS — K5790 Diverticulosis of intestine, part unspecified, without perforation or abscess without bleeding: Secondary | ICD-10-CM

## 2021-09-12 DIAGNOSIS — R7401 Elevation of levels of liver transaminase levels: Secondary | ICD-10-CM

## 2021-09-12 DIAGNOSIS — E669 Obesity, unspecified: Secondary | ICD-10-CM

## 2021-09-12 DIAGNOSIS — Z6835 Body mass index (BMI) 35.0-35.9, adult: Secondary | ICD-10-CM

## 2021-09-12 DIAGNOSIS — K760 Fatty (change of) liver, not elsewhere classified: Secondary | ICD-10-CM

## 2021-09-12 DIAGNOSIS — K7689 Other specified diseases of liver: Secondary | ICD-10-CM

## 2021-09-12 DIAGNOSIS — Z1211 Encounter for screening for malignant neoplasm of colon: Secondary | ICD-10-CM

## 2021-09-12 MED ORDER — PEG 3350-ELECTROLYTES 236 GRAM-22.74 GRAM-6.74 GRAM-5.86 GRAM SOLUTION
4.0000 L | Freq: Once | ORAL | 0 refills | Status: AC
Start: 2021-09-12 — End: 2021-09-12

## 2021-09-12 MED ORDER — BISACODYL 5 MG TABLET
4.0000 | ORAL_TABLET | Freq: Once | ORAL | 0 refills | Status: AC
Start: 2021-09-12 — End: 2021-09-12

## 2021-09-12 NOTE — Progress Notes (Unsigned)
[facility]                                                      Department of Gastroenterology  [place], [place] [address]    Date:   09/12/2021  Name: Erica Harper  Age: 61 y.o.    Referring Provider:    [name], MD  [place]  [place],  [place] [address]    Primary Care Provider:    [name], MD    Chief Complaint: liver cyst, CRC screening, hx of  GB polyp , NAFLD, elevated ALT    History of Present Illness  The history is obtained from the patient and chart reveiw  EricaSAVANNHA Harper is a pleasant 61 y.o. female with past medical history of GB polyp, NSAID use, obesity, liver cysts    Clinic visit 03-01-21  ===============  Pt is here for new visit   No smoking or drinking   Last colonosocop 5 yr was with fair prep, diverticulosis seen, recommended to repeat after 5 yrs   No FH of liver disease, cancer or GI cancer    taking ibuprofen as needed   Had liver US in 2017 cyst size 3.7cm , last [place] 12/2020 cyst size 4.8 cm   in 2017 GB polyps seen up to 59m, last [place] not show polyps or stones     The patient denies any heartburn, difficulty with swallowing, abdominal pain, nausea, vomiting, abdominal bloating, constipation, diarrhea, rectal bleeding, melena, hematemesis, unintentional weight loss or other complaints.      Clinic visit  09-12-2021   =================  Patient is here for follow up   Denies any GI complaints  Regular bowel movements  Had MRI done recently showed liver cysts, no gallbladder polyps  Patient due for surveillance colonoscopy  Liver panel in March 2023 showed elevated ALT at 33    The patient denies any heartburn, difficulty with swallowing, abdominal pain, nausea, vomiting, abdominal bloating, constipation, diarrhea, rectal bleeding, melena, hematemesis, unintentional weight loss or other complaints.      Review of Systems  See HPI.  No chest pain or sob  No fever or chills     Past Medical History: She  has a past medical history of Rosacea.    She has  no past medical history of Breast CA (CMS HCC), Cancer (CMS HCC), Cervical cancer (CMS HA[place], Colon cancer (CMS [place], Endometrial cancer (CMS [facility], Ovarian cancer (CMS H[place], Treatment, or Uterine cancer (CMS HJessie.      Past Surgical History: She  has a past surgical history that includes hx cesarean section; hx adenoidectomy; hx endometrial ablation; hx hysterectomy (2017); and hx oophorectomy.    Social History: Her  reports that she has never smoked. She has never used smokeless tobacco. She reports current alcohol use. She reports that she does not use drugs., ,     Family History: She family history includes Breast Cancer (age of onset: 95 in her maternal grandmother; Cancer in her father; Diabetes in her maternal grandfather; Heart Disease (age of onset: 761 in her mother; Hypertension (High Blood Pressure) in her father and mother.    Allergies: She is allergic to penicillins and lipitor [atorvastatin].    Home Medication:   Current Outpatient Medications   Medication Sig  cyanocobalamin (VITAMIN B 12) 1,000 mcg Oral Tablet Take 1 Tablet (1,000 mcg total) by mouth Once a day (Patient not taking: Reported on 09/12/2021)    ergocalciferol, vitamin D2, (DRISDOL) 1,250 mcg (50,000 unit) Oral Capsule Take 1 Capsule (50,000 Units total) by mouth Every 7 days    estradioL (ESTRACE) 0.01 % (0.1 mg/gram) Vaginal Cream Insert 0.5gm into vagina qhs x 2 weeks and then twice a week as needed    fluconazole (DIFLUCAN) 150 mg Oral Tablet Take one tablet today and repeat in two days (Patient not taking: Reported on 09/12/2021)    Ibuprofen (MOTRIN) 200 mg Oral Tablet Take 1 Tablet (200 mg total) by mouth Four times a day as needed for Pain (Patient not taking: Reported on 09/12/2021)    multivitamin Oral Tablet Take 1 Tablet by mouth Once a day    simvastatin (ZOCOR) 20 mg Oral Tablet TAKE 1 TABLET BY MOUTH EVERY DAY IN THE EVENING       Examination:  BP (!) 153/78   Pulse 83   Temp 36.1 C (97 F) (Temporal)   Resp 16    Ht 1.626 m ('5\' 4"'$ )   Wt 94 kg (207 lb 3.7 oz)   LMP 03/19/2015   SpO2 99%   BMI 35.57 kg/m        Wt Readings from Last 3 Encounters:   09/12/21 94 kg (207 lb 3.7 oz)   05/20/21 89.7 kg (197 lb 11.2 oz)   04/24/21 87.8 kg (193 lb 9.6 oz)       General: Resting comfortably, no acute distress.  Eyes: Conjunctiva normal, sclera non-icteric.  HENT: Atraumatic and normocephalic.   Neck:  Supple.   Lungs: Breathing comfortably.  No respiratory distress.  Extremities: No cyanosis or edema  Neurologic: Alert, Oriented, Grossly normal  Psychiatric: Appears normal    Data/Chart/Labs/Imaging reviewed:  Colonoscopy with fair prep by Dr Rowe Clack 08/2016  - The perianal and digital rectal examinations were normal.  - A few small-mouthed diverticula were found in the sigmoid colon.  - The exam was otherwise without abnormality on direct and retroflexion views.    Recommended to repeat after 5 yrs     Liver US in 2017   IMPRESSION:  1. Gallbladder polyps measuring up to 5 mm.  2. 3.7 cm right hepatic lobe cyst is likely benign.       Liver US 12/2020  FINDINGS: Transabdominal right upper quadrant ultrasound shows a 4.8 cm right hepatic lobe cyst and a 9 mm simple cyst left hepatic lobe. There is no intrahepatic biliary duct dilatation. Pancreas is unremarkable. No cholelithiasis or gallbladder wall thickening is seen. Common bile duct measures 2 mm. Right kidney shows no stones or hydronephrosis.     IMPRESSION:  1. Confirmation multiple hepatic cysts.  2. No cholelithiasis.    CT chest 04/2020   -No PE   -There is a 3 cm low-density focus in the right hepatic lobe which is partially visualized. This may be a cyst or hemangioma. Recommend ultrasound follow-up to confirm. At least 2 additional low-density lesions are noted in the right hepatic lobe    MRI abdomen 03/2021     1. Benign hepatic cysts in the liver, measuring up to 3.9 cm in the inferior right hepatic lobe. No suspicious mass seen.  2. Dropout of signal on opposed phase  imaging the liver consistent with moderate hepatic steatosis. No capsular contour irregularity to suggest cirrhosis  3. Normal MR appearance of the galbladder. No stones.  No intrahepatic or extrahepatic biliary ductal dilation     CBC 12/2020: normal   INR 12/2020: normal   CMP 12/2020: normal   Liver panel March 2023 total protein 7.6, albumin 4.0, total bilirubin 0.5, AST 26, ALT 33, alkaline phosphatase 88  B12 in March 2023 515  Viral hepatitis panel in February 2023 unremarkable, patient needs vaccination for hepatitis-A and B  CBC in March 2023 WBC 7.7, hemoglobin 14.0, platelet 288  ________________________________________________  Assessment and Plan  ZAHRIYAH JOO is a 61 y.o. female with the following issues:    Bilateral lobe liver simple cysts, up to 4.8 cm. Cyst size was 3.7 in 2017. MRI 03/2021 size 3.9 cm   Hx of Gallbladder polyps up to 5 mm in 2017, follow up US did not show any polyps or stones. Patient is asymptomatic . MRI 03/2021 with normal GB   Elevated ALT 04/2021   NAFLD per MRI 03/2021   CRC screening, surveillance colonoscopy due 04/8754   Uncomplicated colonic diverticulosis   Obesity with BMI 35.5  Intermittent Long term NSAIDs use       My recommendations are as follows:    -I personally reviewed previous GI workup/procedures/labs and relevant imaging  -will repeat MRI 03/2022  -Will give vaccine for hepatitis A/B, confirm immunity after finishing  -Will schedule surveillance colonoscopy  -Repeat LFT, will order chronic liver disease work up next visit if continued to have elevated ALT  -Avoid NSAIDs   -I have counseled the patient on health risks with high BMI and strategies for weight loss including regular measurement of weight, calorie restriction, portion control, increased aerobic exercise and regular walking. Patient instructed to loose at least 10 % of the current weight   -Avoid alcohol intake   -Recommended to eat a high-fiber diet with fresh fruits and vegetables.  Suggested to  increase fluid intake and to drink more water when eating more fiber.  Also suggested regular exercise.        Discussed with patient the consent for Colonoscopy. Alternative to endoscopy were also discussed. The risks/benefits and complications including but not limited to anesthesia, bleeding, dental injury, aspiration, CV/pulmonary risk, perforation, infection, missed lesions, need for surgery & remote possibility of death were discussed with patient who understand and agreed to proceed.       The patient was given the opportunity to ask questions and those questions were appropriately answered. The patient agreed with the treatment plan and is encouraged to call with any additional questions or concerns.         Return to the clinic in 6 months     [facility], [facility]  [place], [place]  [id number]    Note: A portion of this documentation was generated using MMODAL (voice recognition software) and may contain syntax/voice recognition errors.

## 2021-09-12 NOTE — Nursing Note (Signed)
Patient here for follow up visit with no complaints

## 2021-09-14 ENCOUNTER — Encounter (HOSPITAL_BASED_OUTPATIENT_CLINIC_OR_DEPARTMENT_OTHER): Payer: Self-pay | Admitting: Student in an Organized Health Care Education/Training Program

## 2021-09-14 DIAGNOSIS — R7401 Elevation of levels of liver transaminase levels: Secondary | ICD-10-CM | POA: Insufficient documentation

## 2021-09-14 DIAGNOSIS — K760 Fatty (change of) liver, not elsewhere classified: Secondary | ICD-10-CM | POA: Insufficient documentation

## 2021-11-10 ENCOUNTER — Other Ambulatory Visit (INDEPENDENT_AMBULATORY_CARE_PROVIDER_SITE_OTHER): Payer: Self-pay | Admitting: Family Medicine

## 2021-11-14 ENCOUNTER — Ambulatory Visit (INDEPENDENT_AMBULATORY_CARE_PROVIDER_SITE_OTHER): Payer: BC Managed Care – PPO | Admitting: Family Medicine

## 2021-11-14 ENCOUNTER — Other Ambulatory Visit (INDEPENDENT_AMBULATORY_CARE_PROVIDER_SITE_OTHER): Payer: Self-pay

## 2021-11-14 ENCOUNTER — Other Ambulatory Visit: Payer: BC Managed Care – PPO | Attending: Family Medicine | Admitting: Family Medicine

## 2021-11-14 ENCOUNTER — Telehealth (INDEPENDENT_AMBULATORY_CARE_PROVIDER_SITE_OTHER): Payer: Self-pay | Admitting: Family Medicine

## 2021-11-14 ENCOUNTER — Encounter (INDEPENDENT_AMBULATORY_CARE_PROVIDER_SITE_OTHER): Payer: Self-pay | Admitting: Family Medicine

## 2021-11-14 VITALS — BP 120/80 | HR 84 | Temp 98.2°F | Resp 17 | Ht 64.0 in | Wt 206.6 lb

## 2021-11-14 DIAGNOSIS — N390 Urinary tract infection, site not specified: Secondary | ICD-10-CM

## 2021-11-14 DIAGNOSIS — G8929 Other chronic pain: Secondary | ICD-10-CM

## 2021-11-14 DIAGNOSIS — N952 Postmenopausal atrophic vaginitis: Secondary | ICD-10-CM

## 2021-11-14 DIAGNOSIS — M1711 Unilateral primary osteoarthritis, right knee: Secondary | ICD-10-CM

## 2021-11-14 LAB — URINALYSIS, MACRO/MICRO
BILIRUBIN: NEGATIVE mg/dL
COLOR: NORMAL
GLUCOSE: NORMAL mg/dL
KETONES: NEGATIVE mg/dL
LEUKOCYTES: 250 WBCs/uL — AB
NITRITE: NEGATIVE
PH: 6 (ref 5.0–8.0)
PROTEIN: 10 mg/dL
SPECIFIC GRAVITY: 1.008 (ref 1.005–1.030)
UROBILINOGEN: NORMAL mg/dL

## 2021-11-14 MED ORDER — NITROFURANTOIN MONOHYDRATE/MACROCRYSTALS 100 MG CAPSULE
100.0000 mg | ORAL_CAPSULE | Freq: Two times a day (BID) | ORAL | 0 refills | Status: AC
Start: 2021-11-14 — End: 2021-11-21

## 2021-11-14 MED ORDER — ESTRADIOL 10 MCG VAGINAL TABLET
ORAL_TABLET | VAGINAL | 2 refills | Status: DC
Start: 2021-11-14 — End: 2022-07-09

## 2021-11-14 NOTE — Telephone Encounter (Signed)
Please offer Erica Harper a referral to Dr. Justice Britain in ortho b/c the MRI is not going to be approved.

## 2021-11-14 NOTE — Telephone Encounter (Signed)
Pt wants to see Dr Jaci Carrel.referral put in computer.

## 2021-11-14 NOTE — Progress Notes (Signed)
[name]  [place] [address]        Encounter Date: 11/14/2021  9:45 AM EDT      Name: Erica Harper  Age: 61 y.o.  DOB: February 12, 1961  Sex: female    Chief Complaint:   Chief Complaint   Patient presents with    Urinary Tract Infection       HPI  Patient is here for increased urination and dysuria which started yesterday.  She does not have a history of recurrent bladder infections.  She does not have any nausea and/or vomiting associated with her symptoms.  She has been trying to increase her water intake.    Patient does have a history of atrophic vaginitis.  She would like to try the vaginal tablets because the cream can be messy.  She does think that the estrogen cream has helped with her vaginal irritation and dryness    Patient has recurrent pain over the front of her right knee for over 2 years.  She did have an x-ray of her knee 2 years ago that was indicative of mild arthritis.  Her pain is worse when she 1st stands up.  She has not had an MRI in the past for further evaluation      History   Family Medical History:       Problem Relation (Age of Onset)    Breast Cancer Maternal Grandmother (96)    Cancer Father    Diabetes Maternal Grandfather    Heart Disease Mother (72)    Hypertension (High Blood Pressure) Mother, Father          Past Medical History:   Diagnosis Date    Rosacea        Past Surgical History:   Procedure Laterality Date    HX ADENOIDECTOMY      HX CESAREAN SECTION      x2    HX ENDOMETRIAL ABLATION      HX HYSTERECTOMY  2017    complete    HX OOPHORECTOMY         Patient Active Problem List    Diagnosis    Elevated ALT measurement    NAFLD (nonalcoholic fatty liver disease)    Vitamin B12 deficiency    Vitamin D deficiency    Hepatic cyst    Gallbladder polyp    Obesity (BMI 30-39.9)    Colon cancer screening    Diverticulosis    Venous insufficiency of both lower extremities    Hyperlipidemia LDL goal <100    Atrophic vaginitis     Current Outpatient  Medications   Medication Sig    estradiol (VAGIFEM) vaginal tablet Insert one tablet into vagina qhs x 2 weeks and then twice a week as needed.    multivitamin Oral Tablet Take 1 Tablet by mouth Once a day    nitrofurantoin monohyd/m-cryst (MACROBID) 100 mg Oral Capsule Take 1 Capsule (100 mg total) by mouth Twice daily for 7 days    simvastatin (ZOCOR) 20 mg Oral Tablet Take 1 Tablet (20 mg total) by mouth Every evening Patient is due for fasting labs     Social History     Tobacco Use   Smoking Status Never   Smokeless Tobacco Never     Social History     Substance and Sexual Activity   Alcohol Use Yes    Comment: Social     Social History     Substance and Sexual Activity  Drug Use No       Review of Systems   Constitutional:  Negative for chills and fever.   HENT:  Negative for trouble swallowing.    Eyes:  Negative for visual disturbance.   Respiratory:  Negative for cough and shortness of breath.    Cardiovascular:  Negative for chest pain and palpitations.   Gastrointestinal:  Negative for abdominal pain.   Endocrine: Negative for cold intolerance and heat intolerance.   Genitourinary:  Negative for vaginal bleeding.   Musculoskeletal:  Positive for gait problem. Negative for back pain.        The knee pain can affect her mobility   Skin:  Negative for rash.   Allergic/Immunologic:        No recurrent infection   Neurological:  Negative for dizziness and weakness.   Hematological:  Does not bruise/bleed easily.   Psychiatric/Behavioral:  Positive for sleep disturbance.         The dysuria keeps her up at night        Examination  Vitals: BP 120/80   Pulse 84   Temp 36.8 C (98.2 F)   Resp 17   Ht 1.626 m ('5\' 4"'$ )   Wt 93.7 kg (206 lb 9.6 oz)   LMP 03/19/2015   SpO2 99%   BMI 35.46 kg/m         Physical Exam  Vitals and nursing note reviewed.   Constitutional:       General: She is not in acute distress.     Appearance: Normal appearance. She is well-developed.   HENT:      Head: Normocephalic and  atraumatic.      Nose: Nose normal.      Mouth/Throat:      Mouth: Mucous membranes are moist.      Pharynx: Oropharynx is clear.   Eyes:      Extraocular Movements: Extraocular movements intact.      Conjunctiva/sclera: Conjunctivae normal.      Pupils: Pupils are equal, round, and reactive to light.   Cardiovascular:      Rate and Rhythm: Normal rate and regular rhythm.   Pulmonary:      Effort: Pulmonary effort is normal. No respiratory distress.      Breath sounds: Normal breath sounds. No wheezing.   Abdominal:      General: Bowel sounds are normal. There is no distension.      Palpations: Abdomen is soft.      Tenderness: There is no abdominal tenderness.   Musculoskeletal:         General: Normal range of motion.      Cervical back: Normal range of motion and neck supple.      Right lower leg: No edema.      Left lower leg: No edema.      Comments: No flank tenderness bilaterally.  No current tenderness upon palpation over her right knee   Skin:     General: Skin is warm and dry.   Neurological:      Mental Status: She is alert and oriented to person, place, and time.      Cranial Nerves: No cranial nerve deficit.      Sensory: No sensory deficit.      Comments: Normal sensation over her right knee   Psychiatric:         Mood and Affect: Mood normal.         Behavior: Behavior normal.  Thought Content: Thought content normal.         Judgment: Judgment normal.         Assessment and Plan  Joyell was seen today for urinary tract infection.    UTI (urinary tract infection)  UA in the office did show leukocyte Estrace.  Patient prefers to start on an antibiotic now instead of waiting for the culture due to her discomfort.  Will follow urine culture  -     nitrofurantoin monohyd/m-cryst (MACROBID) 100 mg Oral Capsule; Take 1 Capsule (100 mg total) by mouth Twice daily for 7 days  -     URINALYSIS WITH REFLEX MICROSCOPIC AND CULTURE IF POSITIVE; Future    Atrophic vaginitis  She prefers to try the vaginal  tablet.  Sent a prescription to the pharmacy.  She will let me know if her insurance does not cover this  -     estradiol (VAGIFEM) vaginal tablet; Insert one tablet into vagina qhs x 2 weeks and then twice a week as needed.    Chronic pain of right knee  Reviewed the x-ray of her right knee from 2019.  Order MRI of knee due to chronic pain.  -     MRI KNEE RIGHT WO CONTRAST; Future        Return for follow up as scheduled.    [name], MD

## 2021-11-14 NOTE — Telephone Encounter (Signed)
MRI Right Knee did not meet medical necessity. Call [phone] for peer to peer.

## 2021-11-17 LAB — URINE CULTURE,ROUTINE: URINE CULTURE: NO GROWTH

## 2021-11-18 ENCOUNTER — Telehealth (INDEPENDENT_AMBULATORY_CARE_PROVIDER_SITE_OTHER): Payer: Self-pay | Admitting: Family Medicine

## 2021-11-18 NOTE — Telephone Encounter (Signed)
-----   Message from [name], MD sent at 11/16/2021  6:56 PM EDT -----  Urine culture did not show any growth.  Please ask her if her symptoms have improved on the antibiotic.  She could have had the irritation due to the vaginal dryness.

## 2021-11-18 NOTE — Telephone Encounter (Signed)
Pt said her symptoms approved. She said she used the estradiol cream and stopped the antibiotic.

## 2021-12-09 ENCOUNTER — Other Ambulatory Visit (HOSPITAL_BASED_OUTPATIENT_CLINIC_OR_DEPARTMENT_OTHER): Payer: Self-pay | Admitting: Sports Medicine

## 2021-12-09 DIAGNOSIS — M25561 Pain in right knee: Secondary | ICD-10-CM

## 2021-12-10 ENCOUNTER — Ambulatory Visit: Payer: BC Managed Care – PPO | Attending: Sports Medicine | Admitting: Sports Medicine

## 2021-12-10 ENCOUNTER — Inpatient Hospital Stay (HOSPITAL_BASED_OUTPATIENT_CLINIC_OR_DEPARTMENT_OTHER)
Admission: RE | Admit: 2021-12-10 | Discharge: 2021-12-10 | Disposition: A | Discharge status: Home / Self Care | Payer: BC Managed Care – PPO | Source: Ambulatory Visit

## 2021-12-10 ENCOUNTER — Other Ambulatory Visit: Payer: Self-pay

## 2021-12-10 VITALS — Ht 64.0 in | Wt 206.0 lb

## 2021-12-10 DIAGNOSIS — M25561 Pain in right knee: Secondary | ICD-10-CM | POA: Insufficient documentation

## 2021-12-10 DIAGNOSIS — M1711 Unilateral primary osteoarthritis, right knee: Secondary | ICD-10-CM | POA: Insufficient documentation

## 2021-12-10 DIAGNOSIS — S83206A Unspecified tear of unspecified meniscus, current injury, right knee, initial encounter: Secondary | ICD-10-CM | POA: Insufficient documentation

## 2021-12-10 NOTE — Progress Notes (Incomplete)
[place]  [place] [address]      NAME: Erica Harper  DATE: 12/10/2021  DOB: October 15, 1960  MRN: [id number]      CHIEF COMPLAINT   Knee Pain (rt)    HPI:  Erica Harper is a 61 y.o. female who presents today for ***.     The pain is described as *** that they rate a *** out of 10 in clinic today. The patient admits to experiencing ***. They *** any numbness or tingling. *** does increase the pain. They state *** does help alleviate the pain. They have tried and failed applying ice and heat, elevating, and taking over the counter medications for pain control. They do have *** joint line pain and difficulty performing daily activities. They have no other complaints of pain or discomfort in clinic today. They deny any neurovascular symptoms or calf pain.    PAST MEDICAL HISTORY:  Patient Active Problem List   Diagnosis    Atrophic vaginitis    Hyperlipidemia LDL goal <100    Venous insufficiency of both lower extremities    Hepatic cyst    Gallbladder polyp    Obesity (BMI 30-39.9)    Diverticulosis    Vitamin B12 deficiency    Vitamin D deficiency    Elevated ALT measurement    NAFLD (nonalcoholic fatty liver disease)    Reviewed  PAST SURGICAL HISTORY:  Past Surgical History:   Procedure Laterality Date    HX ADENOIDECTOMY      HX CESAREAN SECTION      x2    HX ENDOMETRIAL ABLATION      HX HYSTERECTOMY  2017    complete    HX OOPHORECTOMY          Reviewed  CURRENT MEDICATIONS:   Current Outpatient Medications   Medication Sig Dispense Refill    estradiol (VAGIFEM) vaginal tablet Insert one tablet into vagina qhs x 2 weeks and then twice a week as needed. 18 Tablet 2    multivitamin Oral Tablet Take 1 Tablet by mouth Once a day      simvastatin (ZOCOR) 20 mg Oral Tablet Take 1 Tablet (20 mg total) by mouth Every evening Patient is due for fasting labs 30 Tablet 0     No current facility-administered medications for this visit.     Reviewed  ALLERGIES:  Penicillins and Lipitor [atorvastatin] Reviewed   SOCIAL HISTORY:   Social History     Socioeconomic History    Marital status: Married   Occupational History     Employer: [employer]     Comment: kindergarten aide [employer]     Comment: sp:  [employer]   Tobacco Use    Smoking status: Never    Smokeless tobacco: Never   Vaping Use    Vaping Use: Never used   Substance and Sexual Activity    Alcohol use: Yes     Comment: Social    Drug use: No    Sexual activity: Yes     Partners: Male     Birth control/protection: Vasectomy   Other Topics Concern    Abuse/Domestic Violence No    Breast Self Exam Yes    Calcium intake adequate Yes    Seat Belt Yes    Ability to Walk 1 Flight of Steps without SOB/CP Yes    Ability To Do Own ADL's Yes    Reviewed  FAMILY  HISTORY:  Family Medical History:       Problem Relation (Age of Onset)    Breast Cancer Maternal Grandmother (96)    Cancer Father    Diabetes Maternal Grandfather    Heart Disease Mother (15)    Hypertension (High Blood Pressure) Mother, Father           Reviewed     ROS  *** knee pain - all others were reviewed and negative    PHYSICAL EXAM  Ht 1.626 m ('5\' 4"'$ )   Wt 93.4 kg (206 lb)   LMP 03/19/2015   BMI 35.36 kg/m             GENERAL EXAM    Constitutional: She is oriented to person, place, and time. She appears well-developed and well-nourished.   HENT:   Head: Normocephalic and atraumatic.   Eyes: Conjunctivae are normal. Pupils are equal, round.   Neck: Normal range of motion.   Cardiovascular: Normal rate.    Pulmonary/Chest: Effort normal.   Abdominal: Non-protuberant   Neurological: She is alert and oriented to person, place, and time.   Skin: Skin is warm and dry.   Psychiatric: She has a normal mood and affect. Her behavior is normal. Judgment and thought content normal.     Nursing note and vitals reviewed.    ORTHO EXAM   EXAM *** KNEE    ROM ***.  Medial joint tenderness to palpation.  Pain with hyperflexion  and extension.  Positive pain with Mcmurray's testing.  ACL/PCL/LCL/MCL are stable.  *** Effusion.  Patella tracks nicely without apprehension.  Quad has good tone.  Skin is normal.  Calf is soft and supple.  Neurovascular exam is normal.  Passive ROM of hip is pain free.      IMAGING     MRI imaging on the *** was reviewed in office today and reveals a *** meniscus tear.    Xray imaging on the *** was reviewed in office today and reveals ***.      ASSESSMENT      ICD-10-CM    1. Primary osteoarthritis of right knee  M17.11             PLAN       The plan is to perform a ***. A date was selected in clinic today for ***. The risks, benefits, details and non-operative alternatives were discussed with the patient. They agreed to proceed and gave informed written consent. Risks of the surgery include but are not limited to the following: cardiac arrest, death, infection, bleeding, stiffness, injury to vital structures (nerves, vessels, ligaments, tendons) permanent, disability, loss of limb, failure of surgery, deep vein thrombosis, or motor or sensory deficits.       I plan to see the patient back 2 weeks postoperatively for re-evaluation to monitor progression and healing.  As of now, I have advised the patient to continue treating the pain conservatively with the use of ice and over-the-counter medications. The patient states understanding and is in agreement with this plan.      No orders of the defined types were placed in this encounter.      The patient was advised to call with any questions, concerns or worsening symptoms.    This note was partially generated using MModal Fluency Direct system, and there may be some incorrect words, spellings, and punctuation that were not noted in checking the note before saving.    Alease Medina, MA  I am scribing for, and  in the presence of, Dr. Jaci Carrel for services provided on 12/10/2021.  Alease Medina, [place]

## 2021-12-19 ENCOUNTER — Telehealth (HOSPITAL_BASED_OUTPATIENT_CLINIC_OR_DEPARTMENT_OTHER): Payer: Self-pay | Admitting: Sports Medicine

## 2021-12-19 NOTE — Telephone Encounter (Signed)
Tried to call pt to let her know her insurance denied her mri. It didn't meet the medical criteria.  She will need 6 weeks of PT. I left a message for her to call me and scanned her denial into media

## 2021-12-26 ENCOUNTER — Telehealth (HOSPITAL_BASED_OUTPATIENT_CLINIC_OR_DEPARTMENT_OTHER): Payer: Self-pay | Admitting: Student in an Organized Health Care Education/Training Program

## 2021-12-26 NOTE — Telephone Encounter (Signed)
Pt's procedure is cancelled at this time.    Spoke to pt:  yes    Procedure:  Colon  Original date/time:  01/14/2022  Provider:  Dr. Minerva Ends  Reason:  has MRI and doctors appt on the same day. Will call back to reschedule at a later date.    Sx removed from original date/time:  yes  Sx sched aware:  Yes  Packet moved to cancelled folder:  Yes    Follow up appointment cancelled:  Yes    Comments:

## 2022-01-06 ENCOUNTER — Inpatient Hospital Stay
Admission: RE | Admit: 2022-01-06 | Discharge: 2022-01-06 | Disposition: A | Discharge status: Home / Self Care | Payer: BC Managed Care – PPO | Source: Ambulatory Visit | Attending: Sports Medicine | Admitting: Sports Medicine

## 2022-01-06 ENCOUNTER — Encounter (INDEPENDENT_AMBULATORY_CARE_PROVIDER_SITE_OTHER): Payer: Self-pay | Admitting: Family Medicine

## 2022-01-06 ENCOUNTER — Other Ambulatory Visit: Payer: Self-pay

## 2022-01-06 ENCOUNTER — Telehealth (INDEPENDENT_AMBULATORY_CARE_PROVIDER_SITE_OTHER): Payer: Self-pay | Admitting: Family Medicine

## 2022-01-06 DIAGNOSIS — S83206A Unspecified tear of unspecified meniscus, current injury, right knee, initial encounter: Secondary | ICD-10-CM | POA: Insufficient documentation

## 2022-01-06 DIAGNOSIS — M25561 Pain in right knee: Secondary | ICD-10-CM | POA: Insufficient documentation

## 2022-01-06 NOTE — Telephone Encounter (Signed)
-----   Message from [name], MD sent at 01/06/2022  3:08 PM EST -----  Patient has a tear in her right medial meniscus.  She has a follow-up with Dr. Jaci Carrel in ortho next week

## 2022-01-06 NOTE — Telephone Encounter (Signed)
Pt.notified

## 2022-01-14 ENCOUNTER — Ambulatory Visit (HOSPITAL_COMMUNITY): Admit: 2022-01-14 | Payer: Self-pay | Admitting: Student in an Organized Health Care Education/Training Program

## 2022-01-14 ENCOUNTER — Encounter (HOSPITAL_COMMUNITY): Payer: Self-pay

## 2022-01-14 ENCOUNTER — Ambulatory Visit: Payer: BC Managed Care – PPO | Attending: Sports Medicine | Admitting: Sports Medicine

## 2022-01-14 ENCOUNTER — Other Ambulatory Visit: Payer: Self-pay

## 2022-01-14 ENCOUNTER — Encounter (HOSPITAL_BASED_OUTPATIENT_CLINIC_OR_DEPARTMENT_OTHER): Payer: Self-pay | Admitting: Sports Medicine

## 2022-01-14 ENCOUNTER — Other Ambulatory Visit (INDEPENDENT_AMBULATORY_CARE_PROVIDER_SITE_OTHER): Payer: Self-pay | Admitting: Family Medicine

## 2022-01-14 VITALS — Ht 64.0 in | Wt 207.0 lb

## 2022-01-14 DIAGNOSIS — S83241A Other tear of medial meniscus, current injury, right knee, initial encounter: Secondary | ICD-10-CM | POA: Insufficient documentation

## 2022-01-14 DIAGNOSIS — Z01818 Encounter for other preprocedural examination: Secondary | ICD-10-CM | POA: Insufficient documentation

## 2022-01-14 DIAGNOSIS — M1711 Unilateral primary osteoarthritis, right knee: Secondary | ICD-10-CM | POA: Insufficient documentation

## 2022-01-14 SURGERY — COLONOSCOPY
Anesthesia: Monitor Anesthesia Care

## 2022-01-15 NOTE — Progress Notes (Signed)
[place]  [place] [address]      NAME: Erica Harper  DATE: 01/14/2022  DOB: Apr 17, 1960  MRN: [id number]      CHIEF COMPLAINT   Follow Up, Knee Pain, and MRI Results (Right knee MRI follow up done on 01/06/22)    HPI:  Erica Harper is a 61 y.o. female who presents today for right knee pain. She is here to follow up on her MRI results on the right knee. She is still having pain. The pain is described as constant and tight that they rate a 3 out of 10 in clinic today. The patient admits to experiencing clicking and popping. They have mild numbness and tingling on the lateral aspect of her knee. Rest and elevation does increase the pain. They state rest and elevation does help alleviate the pain. They have tried and failed applying ice and heat, elevating, and taking over the counter medications for pain control. They do have medial joint line pain and difficulty performing daily activities. They have no other complaints of pain or discomfort in clinic today. They deny any neurovascular symptoms or calf pain.    PAST MEDICAL HISTORY:  Patient Active Problem List   Diagnosis    Atrophic vaginitis    Hyperlipidemia LDL goal <100    Venous insufficiency of both lower extremities    Hepatic cyst    Gallbladder polyp    Obesity (BMI 30-39.9)    Diverticulosis    Vitamin B12 deficiency    Vitamin D deficiency    Elevated ALT measurement    NAFLD (nonalcoholic fatty liver disease)    Reviewed  PAST SURGICAL HISTORY:  Past Surgical History:   Procedure Laterality Date    HX ADENOIDECTOMY      HX CESAREAN SECTION      x2    HX ENDOMETRIAL ABLATION      HX HYSTERECTOMY  2017    complete    HX OOPHORECTOMY          Reviewed  CURRENT MEDICATIONS:   Current Outpatient Medications   Medication Sig Dispense Refill    estradiol (VAGIFEM) vaginal tablet Insert one tablet into vagina qhs x 2 weeks and then twice a week as needed. 18 Tablet 2    multivitamin Oral Tablet  Take 1 Tablet by mouth Once a day      simvastatin (ZOCOR) 20 mg Oral Tablet Take 1 Tablet (20 mg total) by mouth Every evening 30 Tablet 0     No current facility-administered medications for this visit.    Reviewed  ALLERGIES:  Penicillins and Lipitor [atorvastatin] Reviewed   SOCIAL HISTORY:   Social History     Socioeconomic History    Marital status: Married   Occupational History     Employer: [employer]     Comment: kindergarten aide [employer]     Comment: sp:  [employer]   Tobacco Use    Smoking status: Never    Smokeless tobacco: Never   Vaping Use    Vaping Use: Never used   Substance and Sexual Activity    Alcohol use: Yes     Comment: Social    Drug use: No    Sexual activity: Yes     Partners: Male     Birth control/protection: Vasectomy   Other Topics Concern    Abuse/Domestic Violence No    Breast Self Exam Yes  Calcium intake adequate Yes    Seat Belt Yes    Ability to Walk 1 Flight of Steps without SOB/CP Yes    Ability To Do Own ADL's Yes    Reviewed  FAMILY HISTORY:  Family Medical History:       Problem Relation (Age of Onset)    Breast Cancer Maternal Grandmother (96)    Cancer Father    Diabetes Maternal Grandfather    Heart Disease Mother (35)    Hypertension (High Blood Pressure) Mother, Father           Reviewed     ROS  right knee pain - all others were reviewed and negative    PHYSICAL EXAM  Ht 1.626 m ('5\' 4"'$ )   Wt 93.9 kg (207 lb)   LMP 03/19/2015   BMI 35.53 kg/m             GENERAL EXAM    Constitutional: She is oriented to person, place, and time. She appears well-developed and well-nourished.   HENT:   Head: Normocephalic and atraumatic.   Eyes: Conjunctivae are normal. Pupils are equal, round.   Neck: Normal range of motion.   Cardiovascular: Normal rate.    Pulmonary/Chest: Effort normal.   Abdominal: Non-protuberant   Neurological: She is alert and oriented to person, place, and time.   Skin: Skin is warm and dry.   Psychiatric: She has a normal mood and  affect. Her behavior is normal. Judgment and thought content normal.     Nursing note and vitals reviewed.    ORTHO EXAM   EXAM RIGHT KNEE    ROM WNL.  Medial joint tenderness to palpation.  Pain with hyperflexion and extension.  Positive pain with Mcmurray's testing.  ACL/PCL/LCL/MCL are stable.  Mild Effusion.  Patella tracks nicely without apprehension.  Quad has good tone.  Skin is normal.  Calf is soft and supple.  Neurovascular exam is normal.  Passive ROM of hip is pain free.      IMAGING     MRI imaging on the right knee was reviewed in office today and reveals a medial meniscus tear and minimal degenerative changes.      ASSESSMENT      ICD-10-CM    1. Primary osteoarthritis of right knee  M17.11       2. Tear of medial meniscus of right knee  S83.241A             PLAN       The plan is to perform a right knee arthroscopy with partial medial meniscectomy. A date was selected in clinic today for 03/05/2022. The risks, benefits, details and non-operative alternatives were discussed with the patient. They agreed to proceed and gave informed written consent. Risks of the surgery include but are not limited to the following: cardiac arrest, death, infection, bleeding, stiffness, injury to vital structures (nerves, vessels, ligaments, tendons) permanent, disability, loss of limb, failure of surgery, deep vein thrombosis, or motor or sensory deficits.       I plan to see the patient back 2 weeks postoperatively for re-evaluation to monitor progression and healing.  As of now, I have advised the patient to continue treating the pain conservatively with the use of ice and over-the-counter medications. The patient states understanding and is in agreement with this plan.      No orders of the defined types were placed in this encounter.      The patient was advised to call with any questions,  concerns or worsening symptoms.    This note was partially generated using MModal Fluency Direct system, and there may be some  incorrect words, spellings, and punctuation that were not noted in checking the note before saving.    Alease Medina, MA  I am scribing for, and in the presence of, Dr. Jaci Carrel for services provided on 01/14/2022.  Alease Medina, [place]

## 2022-01-28 ENCOUNTER — Other Ambulatory Visit (HOSPITAL_BASED_OUTPATIENT_CLINIC_OR_DEPARTMENT_OTHER): Payer: Self-pay | Admitting: Sports Medicine

## 2022-01-28 DIAGNOSIS — S83241A Other tear of medial meniscus, current injury, right knee, initial encounter: Secondary | ICD-10-CM

## 2022-02-09 ENCOUNTER — Other Ambulatory Visit (INDEPENDENT_AMBULATORY_CARE_PROVIDER_SITE_OTHER): Payer: Self-pay | Admitting: Family Medicine

## 2022-02-12 ENCOUNTER — Other Ambulatory Visit (INDEPENDENT_AMBULATORY_CARE_PROVIDER_SITE_OTHER): Payer: Self-pay

## 2022-02-12 MED ORDER — SIMVASTATIN 20 MG TABLET
20.0000 mg | ORAL_TABLET | Freq: Every evening | ORAL | 0 refills | Status: DC
Start: 2022-02-12 — End: 2022-03-03

## 2022-02-17 HISTORY — PX: HX MENISCECTOMY: SHX123

## 2022-02-24 ENCOUNTER — Encounter (HOSPITAL_COMMUNITY): Payer: Self-pay | Admitting: Sports Medicine

## 2022-03-01 ENCOUNTER — Other Ambulatory Visit: Payer: Self-pay

## 2022-03-01 ENCOUNTER — Other Ambulatory Visit (HOSPITAL_COMMUNITY): Payer: Self-pay

## 2022-03-01 ENCOUNTER — Other Ambulatory Visit: Payer: BC Managed Care – PPO | Attending: Student in an Organized Health Care Education/Training Program

## 2022-03-01 DIAGNOSIS — IMO0001 Reserved for inherently not codable concepts without codable children: Secondary | ICD-10-CM

## 2022-03-01 DIAGNOSIS — R7401 Elevation of levels of liver transaminase levels: Secondary | ICD-10-CM | POA: Insufficient documentation

## 2022-03-01 LAB — CBC WITH DIFF
BASOPHIL #: <0.1 10*3/uL (ref <=0.20)
BASOPHIL %: 0 %
EOSINOPHIL #: 0.23 10*3/uL (ref <=0.50)
EOSINOPHIL %: 4 %
HCT: 43.4 % (ref 34.8–46.0)
HGB: 14.3 g/dL (ref 11.5–16.0)
IMMATURE GRANULOCYTE #: <0.1 10*3/uL (ref <0.10)
IMMATURE GRANULOCYTE %: 0 % (ref 0.0–1.0)
LYMPHOCYTE #: 1.63 10*3/uL (ref 1.00–4.80)
LYMPHOCYTE %: 31 %
MCH: 29.3 pg (ref 26.0–32.0)
MCHC: 32.9 g/dL (ref 31.0–35.5)
MCV: 88.9 fL (ref 78.0–100.0)
MONOCYTE #: 0.4 10*3/uL (ref 0.20–1.10)
MONOCYTE %: 8 %
MPV: 10.4 fL (ref 8.7–12.5)
NEUTROPHIL #: 3.06 10*3/uL (ref 1.50–7.70)
NEUTROPHIL %: 57 %
PLATELETS: 294 10*3/uL (ref 150–400)
RBC: 4.88 10*6/uL (ref 3.85–5.22)
RDW-CV: 14.2 % (ref 11.5–15.5)
WBC: 5.3 10*3/uL (ref 3.7–11.0)

## 2022-03-01 LAB — COMPREHENSIVE METABOLIC PANEL, NON-FASTING
ALBUMIN: 4 g/dL (ref 3.4–4.8)
ALKALINE PHOSPHATASE: 81 U/L (ref 50–130)
ALT (SGPT): 22 U/L (ref 8–22)
ANION GAP: 10 mmol/L (ref 4–13)
AST (SGOT): 20 U/L (ref 8–45)
BILIRUBIN TOTAL: 0.5 mg/dL (ref 0.3–1.3)
BUN/CREA RATIO: 15 (ref 6–22)
BUN: 13 mg/dL (ref 8–25)
CALCIUM: 9.5 mg/dL (ref 8.6–10.3)
CHLORIDE: 106 mmol/L (ref 96–111)
CO2 TOTAL: 23 mmol/L (ref 23–31)
CREATININE: 0.87 mg/dL (ref 0.60–1.05)
ESTIMATED GFR - FEMALE: 76 mL/min/BSA (ref >=60)
GLUCOSE: 81 mg/dL (ref 65–125)
POTASSIUM: 4.3 mmol/L (ref 3.5–5.1)
PROTEIN TOTAL: 7.8 g/dL (ref 6.0–8.0)
SODIUM: 139 mmol/L (ref 136–145)

## 2022-03-01 LAB — VITAMIN D 25 TOTAL: VITAMIN D 25, TOTAL: 28.8 ng/mL — ABNORMAL LOW (ref 30.0–100.0)

## 2022-03-01 LAB — MAGNESIUM: MAGNESIUM: 2.2 mg/dL (ref 1.8–2.6)

## 2022-03-01 LAB — LIPID PANEL
CHOL/HDL RATIO: 2.5
CHOLESTEROL: 208 mg/dL — ABNORMAL HIGH (ref 100–200)
HDL CHOL: 84 mg/dL (ref >=50)
LDL CALC: 113 mg/dL — ABNORMAL HIGH (ref <100)
NON-HDL: 124 mg/dL (ref <=190)
TRIGLYCERIDES: 62 mg/dL (ref <150)
VLDL CALC: 10 mg/dL (ref <30)

## 2022-03-01 LAB — PHOSPHORUS: PHOSPHORUS: 3.1 mg/dL (ref 2.3–4.0)

## 2022-03-01 LAB — URIC ACID: URIC ACID: 5.2 mg/dL (ref 2.9–6.3)

## 2022-03-01 LAB — BILIRUBIN, CONJUGATED (DIRECT): BILIRUBIN DIRECT: 0.2 mg/dL (ref 0.1–0.4)

## 2022-03-03 ENCOUNTER — Other Ambulatory Visit (INDEPENDENT_AMBULATORY_CARE_PROVIDER_SITE_OTHER): Payer: Self-pay | Admitting: Family Medicine

## 2022-03-03 MED ORDER — ERGOCALCIFEROL (VITAMIN D2) 1,250 MCG (50,000 UNIT) CAPSULE
50000.0000 [IU] | ORAL_CAPSULE | ORAL | 1 refills | Status: AC
Start: 2022-03-03 — End: 2022-08-30

## 2022-03-03 MED ORDER — SIMVASTATIN 20 MG TABLET
20.0000 mg | ORAL_TABLET | Freq: Every evening | ORAL | 1 refills | Status: DC
Start: 2022-03-03 — End: 2022-10-09

## 2022-03-03 NOTE — Telephone Encounter (Signed)
Patient had labs this past weekend  CBC and chem 14 are grossly WNL  LDL is 113 should be below 100.  Continue Zocor and decrease fatty food intake to help get this below 100  Magnesium, phosphorus, and uric acid are within normal limits  Vitamin-D is low at 28.  Take vitamin-D 50000 units once a week for 6 months

## 2022-03-03 NOTE — Telephone Encounter (Signed)
Informed patient

## 2022-03-05 ENCOUNTER — Encounter (HOSPITAL_COMMUNITY): Payer: Self-pay | Admitting: Sports Medicine

## 2022-03-05 ENCOUNTER — Inpatient Hospital Stay
Admission: RE | Admit: 2022-03-05 | Discharge: 2022-03-05 | Disposition: A | Discharge status: Home / Self Care | Payer: BC Managed Care – PPO | Source: Ambulatory Visit | Attending: Sports Medicine | Admitting: Sports Medicine

## 2022-03-05 ENCOUNTER — Other Ambulatory Visit: Payer: Self-pay

## 2022-03-05 ENCOUNTER — Encounter (HOSPITAL_COMMUNITY)
Admission: RE | Disposition: A | Discharge status: Home / Self Care | Payer: Self-pay | Source: Ambulatory Visit | Attending: Sports Medicine

## 2022-03-05 ENCOUNTER — Ambulatory Visit (HOSPITAL_COMMUNITY): Payer: BC Managed Care – PPO | Admitting: Certified Registered"

## 2022-03-05 DIAGNOSIS — S83241A Other tear of medial meniscus, current injury, right knee, initial encounter: Secondary | ICD-10-CM

## 2022-03-05 DIAGNOSIS — M1711 Unilateral primary osteoarthritis, right knee: Secondary | ICD-10-CM | POA: Insufficient documentation

## 2022-03-05 DIAGNOSIS — E785 Hyperlipidemia, unspecified: Secondary | ICD-10-CM | POA: Insufficient documentation

## 2022-03-05 DIAGNOSIS — Z79899 Other long term (current) drug therapy: Secondary | ICD-10-CM | POA: Insufficient documentation

## 2022-03-05 DIAGNOSIS — Z79890 Hormone replacement therapy (postmenopausal): Secondary | ICD-10-CM | POA: Insufficient documentation

## 2022-03-05 DIAGNOSIS — Z6835 Body mass index (BMI) 35.0-35.9, adult: Secondary | ICD-10-CM | POA: Insufficient documentation

## 2022-03-05 DIAGNOSIS — E669 Obesity, unspecified: Secondary | ICD-10-CM | POA: Insufficient documentation

## 2022-03-05 DIAGNOSIS — S83249A Other tear of medial meniscus, current injury, unspecified knee, initial encounter: Secondary | ICD-10-CM

## 2022-03-05 HISTORY — DX: Hyperlipidemia, unspecified: E78.5

## 2022-03-05 SURGERY — ARTHROSCOPY KNEE WITH MENISECTOMY
Anesthesia: General | Site: Knee | Laterality: Right | Wound class: Clean Wound: Uninfected operative wounds in which no inflammation occurred

## 2022-03-05 MED ORDER — SODIUM CHLORIDE 0.9 % (FLUSH) INJECTION SYRINGE
3.0000 mL | INJECTION | Freq: Three times a day (TID) | INTRAMUSCULAR | Status: DC
Start: 2022-03-05 — End: 2022-03-05
  Administered 2022-03-05: 3 mL

## 2022-03-05 MED ORDER — LACTATED RINGERS INTRAVENOUS SOLUTION
INTRAVENOUS | Status: DC
Start: 2022-03-05 — End: 2022-03-05

## 2022-03-05 MED ORDER — METHYLPREDNISOLONE ACETATE 80 MG/ML SUSPENSION FOR INJECTION
Freq: Once | INTRAMUSCULAR | Status: DC | PRN
Start: 2022-03-05 — End: 2022-03-05
  Administered 2022-03-05: 80 mg via INTRAMUSCULAR

## 2022-03-05 MED ORDER — MEPERIDINE (PF) 25 MG/ML INJECTION SOLUTION
12.5000 mg | INTRAMUSCULAR | Status: DC | PRN
Start: 2022-03-05 — End: 2022-03-05

## 2022-03-05 MED ORDER — DEXAMETHASONE SODIUM PHOSPHATE 4 MG/ML INJECTION SOLUTION
Freq: Once | INTRAMUSCULAR | Status: DC | PRN
Start: 2022-03-05 — End: 2022-03-05
  Administered 2022-03-05: 4 mg via INTRAVENOUS

## 2022-03-05 MED ORDER — ONDANSETRON HCL (PF) 4 MG/2 ML INJECTION SOLUTION
Freq: Once | INTRAMUSCULAR | Status: DC | PRN
Start: 2022-03-05 — End: 2022-03-05
  Administered 2022-03-05: 4 mg via INTRAVENOUS

## 2022-03-05 MED ORDER — EPHEDRINE SULFATE 5 MG/ML INTRAVENOUS SOLUTION
Freq: Once | INTRAVENOUS | Status: DC | PRN
Start: 2022-03-05 — End: 2022-03-05
  Administered 2022-03-05: 5 mg via INTRAVENOUS

## 2022-03-05 MED ORDER — LIDOCAINE (PF) 100 MG/5 ML (2 %) INTRAVENOUS SYRINGE
INJECTION | Freq: Once | INTRAVENOUS | Status: DC | PRN
Start: 2022-03-05 — End: 2022-03-05
  Administered 2022-03-05: 100 mg via INTRAVENOUS

## 2022-03-05 MED ORDER — LABETALOL 5 MG/ML INTRAVENOUS SOLUTION
5.0000 mg | INTRAVENOUS | Status: DC | PRN
Start: 2022-03-05 — End: 2022-03-05

## 2022-03-05 MED ORDER — IPRATROPIUM 0.5 MG-ALBUTEROL 3 MG (2.5 MG BASE)/3 ML NEBULIZATION SOLN
3.0000 mL | INHALATION_SOLUTION | Freq: Once | RESPIRATORY_TRACT | Status: DC | PRN
Start: 2022-03-05 — End: 2022-03-05

## 2022-03-05 MED ORDER — CLINDAMYCIN 900 MG/50 ML IN 5 % DEXTROSE INTRAVENOUS PIGGYBACK
INJECTION | Freq: Once | INTRAVENOUS | Status: DC | PRN
Start: 2022-03-05 — End: 2022-03-05
  Administered 2022-03-05: 900 mg via INTRAVENOUS

## 2022-03-05 MED ORDER — SODIUM CHLORIDE 0.9 % IRRIGATION SOLUTION
Freq: Once | Status: DC | PRN
Start: 2022-03-05 — End: 2022-03-05
  Administered 2022-03-05: 3000 mL

## 2022-03-05 MED ORDER — ONDANSETRON HCL (PF) 4 MG/2 ML INJECTION SOLUTION
4.0000 mg | Freq: Once | INTRAMUSCULAR | Status: DC | PRN
Start: 2022-03-05 — End: 2022-03-05

## 2022-03-05 MED ORDER — FENTANYL (PF) 50 MCG/ML INJECTION SOLUTION
Freq: Once | INTRAMUSCULAR | Status: DC | PRN
Start: 2022-03-05 — End: 2022-03-05
  Administered 2022-03-05: 100 ug via INTRAVENOUS

## 2022-03-05 MED ORDER — ACETAMINOPHEN 500 MG TABLET
1000.0000 mg | ORAL_TABLET | Freq: Once | ORAL | Status: AC
Start: 2022-03-05 — End: 2022-03-05
  Administered 2022-03-05: 1000 mg via ORAL
  Filled 2022-03-05: qty 2

## 2022-03-05 MED ORDER — FENTANYL (PF) 50 MCG/ML INJECTION SOLUTION
INTRAMUSCULAR | Status: AC
Start: 2022-03-05 — End: 2022-03-05
  Filled 2022-03-05: qty 2

## 2022-03-05 MED ORDER — ASPIRIN 81 MG TABLET,DELAYED RELEASE
81.0000 mg | DELAYED_RELEASE_TABLET | Freq: Two times a day (BID) | ORAL | Status: DC
Start: 2022-03-05 — End: 2022-08-11

## 2022-03-05 MED ORDER — LIDOCAINE HCL 10 MG/ML (1 %) INJECTION SOLUTION
Freq: Once | INTRAMUSCULAR | Status: DC | PRN
Start: 2022-03-05 — End: 2022-03-05
  Administered 2022-03-05: 20 mL via INTRADERMAL

## 2022-03-05 MED ORDER — PROCHLORPERAZINE EDISYLATE 10 MG/2 ML (5 MG/ML) INJECTION SOLUTION
5.0000 mg | Freq: Once | INTRAMUSCULAR | Status: DC | PRN
Start: 2022-03-05 — End: 2022-03-05

## 2022-03-05 MED ORDER — HYDRALAZINE 20 MG/ML INJECTION SOLUTION
5.0000 mg | Freq: Once | INTRAMUSCULAR | Status: DC | PRN
Start: 2022-03-05 — End: 2022-03-05

## 2022-03-05 MED ORDER — PROPOFOL 10 MG/ML IV BOLUS
INJECTION | Freq: Once | INTRAVENOUS | Status: DC | PRN
Start: 2022-03-05 — End: 2022-03-05
  Administered 2022-03-05: 150 mg via INTRAVENOUS

## 2022-03-05 MED ORDER — HYDROMORPHONE (PF) 0.5 MG/0.5 ML INJECTION SYRINGE
0.5000 mg | INJECTION | INTRAMUSCULAR | Status: DC | PRN
Start: 2022-03-05 — End: 2022-03-05

## 2022-03-05 MED ORDER — FENTANYL (PF) 50 MCG/ML INJECTION SOLUTION
25.0000 ug | INTRAMUSCULAR | Status: DC | PRN
Start: 2022-03-05 — End: 2022-03-05

## 2022-03-05 MED ORDER — HYDROCODONE 7.5 MG-ACETAMINOPHEN 325 MG TABLET
1.0000 | ORAL_TABLET | Freq: Four times a day (QID) | ORAL | 0 refills | Status: DC | PRN
Start: 2022-03-05 — End: 2022-10-09

## 2022-03-05 MED ORDER — SODIUM CHLORIDE 0.9 % (FLUSH) INJECTION SYRINGE
3.0000 mL | INJECTION | INTRAMUSCULAR | Status: DC | PRN
Start: 2022-03-05 — End: 2022-03-05

## 2022-03-05 SURGICAL SUPPLY — 20 items
APPL 70% ISPRP 2% CHG 26ML CHLRPRP HI-LT ORNG PREP STRL LF  DISP CLR (MED SURG SUPPLIES) ×2 IMPLANT
BANDAGE 4.1YDX4.5IN 6 PLY HYPOALL COTTON LRG GAUZE WHT STRL LF  DISP (WOUND CARE SUPPLY) ×1 IMPLANT
BANDAGE ESMARK 12FTX6IN STRL SYN COMPRESS LF (WOUND CARE SUPPLY) ×1 IMPLANT
BANDAGE TETRA-FLX 11YDX6IN STRL HVDTY CLIP FREE ELAS COMPRESS LF (WOUND CARE SUPPLY) ×1 IMPLANT
BLADE SHAVER 13CM 4MM COOLCUT TORPEDO 2 INNER CUT (ENDOSCOPIC SUPPLIES) ×1 IMPLANT
BLADE SURG CLPR W 37.2MM GP EXIST HNDL GTT IN CHRG .23MM NONST LF  DISP (MED SURG SUPPLIES) IMPLANT
CONV USE 405187 - CUFF TOURNIQUET BLU 30X4IN ATS 3K CYL 2 PORT BLADDER POS LOCK CONN SLEEVE STRL LF  DISP (MED SURG SUPPLIES) ×1 IMPLANT
COVER RIGID STRL LF  CNVRT LIGHT HNDL PLASTIC DISP GRN (MED SURG SUPPLIES) ×1 IMPLANT
CUFF TOURNIQUET BLU 30X4IN ATS 3K CYL 2 PORT BLADDER POS LOCK CONN SLEEVE STRL LF  DISP (MED SURG SUPPLIES) ×1
CUFF TOURNIQUET RUBY 42X4IN ATS 2 PORT 1 BLADDER SLEEVE POS LOCK CONN STRL LF  DISP (MED SURG SUPPLIES) IMPLANT
DRAPE ADH 51X47IN STRDRP LF  STRL DISP SURG CLR (DRAPE/PACKS/SHEETS/OR TOWEL) ×2 IMPLANT
DRESS PETRO 8X1IN CURAD XR COTTON NONADH OCL IMPREGNATE LF  STRL WHT (WOUND CARE SUPPLY) ×1 IMPLANT
GOWN SURG 2XL XLNG L4 REINF HKLP CLSR BRTHBL FILM SLEEVE STRL LF  DISP BLU SIRUS SMS PE 56IN (DRAPE/PACKS/SHEETS/OR TOWEL) ×1 IMPLANT
PACK SURG KNEE ASCP STRL DISP LF (CUSTOM TRAYS & PACK) ×1
PAD ABDOMINAL 9X5IN LF  STRL DISP (WOUND CARE SUPPLY) ×1 IMPLANT
PAD POSITION VELCRO FOAM PVT POST NONST DISP (MED SURG SUPPLIES) ×1 IMPLANT
POSITION OR LEG FOAM WELL (MED SURG SUPPLIES) ×1 IMPLANT
PUMP TUBING 13FT CONT WV III DUALWAVE ARTHRO STRL DISP (MED SURG SUPPLIES) ×1 IMPLANT
SPONGE GAUZE 4X4IN MDCHC COTTON 16 PLY USP TY VII LF  STRL DISP (WOUND CARE SUPPLY) ×1 IMPLANT
TRAY KNEE ARTHROSCOPY - ~~LOC~~ (CUSTOM TRAYS & PACK) ×1 IMPLANT

## 2022-03-05 NOTE — Anesthesia Postprocedure Evaluation (Signed)
Anesthesia Post Op Evaluation    Patient: Erica Harper  Procedure(s):  RIGHT KNEE ARTHROSCOPY WITH PARTIAL MEDIAL MENISCECTOMY    Last Vitals:Temperature: 36.6 C (97.9 F) (03/05/22 1024)  Heart Rate: 80 (03/05/22 1024)  BP (Non-Invasive): 116/69 (03/05/22 1024)  Respiratory Rate: 12 (03/05/22 1024)  SpO2: 100 % (03/05/22 1024)    No notable events documented.    Patient is sufficiently recovered from the effects of anesthesia to participate in the evaluation and has returned to their pre-procedure level.  Patient location during evaluation: PACU       Patient participation: complete - patient participated  Level of consciousness: awake and alert and responsive to verbal stimuli    Pain management: adequate  Airway patency: patent    Anesthetic complications: no  Cardiovascular status: acceptable  Respiratory status: acceptable  Hydration status: acceptable  Patient post-procedure temperature: Pt Normothermic   PONV Status: Absent

## 2022-03-05 NOTE — Anesthesia Transfer of Care (Signed)
ANESTHESIA TRANSFER OF CARE   Erica Harper is a 62 y.o. ,female, Weight: 95.1 kg (209 lb 11.2 oz)   had Procedure(s):  RIGHT KNEE ARTHROSCOPY WITH PARTIAL MEDIAL MENISCECTOMY  performed  03/05/22   Primary Service: Soundra Pilon, MD    Past Medical History:   Diagnosis Date    Hyperlipidemia     Rosacea       Allergy History as of 03/05/22       PENICILLINS         Noted Status Severity Type Reaction    06/11/09 1044 Latanya Presser, LPN 97/35/32 Active High Topical Hives/ Urticaria              ATORVASTATIN         Noted Status Severity Type Reaction    01/02/21 1331 Woofter, [place], MD 01/02/21 Active Low Intolerance Myalgia                  I completed my transfer of care / handoff to the receiving personnel during which we discussed:  Access, Airway, All key/critical aspects of case discussed, Analgesia, Antibiotics, Expectation of post procedure, Fluids/Product, Gave opportunity for questions and acknowledgement of understanding, Labs and PMHx      Post Location: PACU                                                             Last OR Temp: Temperature: 36.6 C (97.9 F)  ABG:  POTASSIUM   Date Value Ref Range Status   03/01/2022 4.3 3.5 - 5.1 mmol/L Final     KETONES   Date Value Ref Range Status   11/14/2021 Negative Negative mg/dL Final     CALCIUM   Date Value Ref Range Status   03/01/2022 9.5 8.6 - 10.3 mg/dL Final     Comment:     Gadolinium-containing contrast can interfere with calcium measurement.     Calculated P Axis   Date Value Ref Range Status   01/06/2021 55 degrees Final     Calculated R Axis   Date Value Ref Range Status   01/06/2021 34 degrees Final     Calculated T Axis   Date Value Ref Range Status   01/06/2021 63 degrees Final     Airway:* No LDAs found *  Blood pressure 116/69, pulse 80, temperature 36.6 C (97.9 F), resp. rate 12, height 1.626 m ('5\' 4"'$ ), weight 95.1 kg (209 lb 11.2 oz), last menstrual period 03/19/2015, SpO2 100%, not currently breastfeeding.

## 2022-03-05 NOTE — Anesthesia Preprocedure Evaluation (Signed)
ANESTHESIA PRE-OP EVALUATION  Planned Procedure: RIGHT KNEE ARTHROSCOPY WITH PARTIAL MEDIAL MENISCECTOMY (Right)  Review of Systems         patient summary reviewed  nursing notes reviewed        Pulmonary     Cardiovascular    Hyperlipidemia ,No peripheral edema,        GI/Hepatic/Renal    liver disease        Endo/Other    obesity,      Neuro/Psych/MS        Cancer                        Physical Assessment      Airway       Mallampati: I    TM distance: >3 FB    Neck ROM: full  Mouth Opening: good.            Dental       Dentition intact             Pulmonary    Breath sounds clear to auscultation  (-) no rhonchi, no decreased breath sounds, no wheezes, no rales and no stridor     Cardiovascular    Rhythm: regular  Rate: Normal  (-) no friction rub, carotid bruit is not present, no peripheral edema and no murmur     Other findings              Plan  ASA 2     Planned anesthesia type: general     general anesthesia with laryngeal mask airway      PONV Plan:  I plan to administer pharmcologic prophalaxis antiemetics  POV PLAN:   plan for postoperative opioid use            Intravenous induction     Anesthesia issues/risks discussed are: Dental Injuries and Sore Throat.  Anesthetic plan and risks discussed with patient  signed consent obtained          Patient's NPO status is appropriate for Anesthesia.

## 2022-03-05 NOTE — H&P (Signed)
NAME: Erica Harper  DATE: 01/14/2022  DOB: 10-05-1960  MRN: [id number]        CHIEF COMPLAINT   Follow Up, Knee Pain, and MRI Results (Right knee MRI follow up done on 01/06/22)     HPI:  Erica Harper is a 62 y.o. female who presents today for right knee pain. She is here to follow up on her MRI results on the right knee. She is still having pain. The pain is described as constant and tight that they rate a 3 out of 10 in clinic today. The patient admits to experiencing clicking and popping. They have mild numbness and tingling on the lateral aspect of her knee. Rest and elevation does increase the pain. They state rest and elevation does help alleviate the pain. They have tried and failed applying ice and heat, elevating, and taking over the counter medications for pain control. They do have medial joint line pain and difficulty performing daily activities. They have no other complaints of pain or discomfort in clinic today. They deny any neurovascular symptoms or calf pain.     PAST MEDICAL HISTORY:      Patient Active Problem List   Diagnosis    Atrophic vaginitis    Hyperlipidemia LDL goal <100    Venous insufficiency of both lower extremities    Hepatic cyst    Gallbladder polyp    Obesity (BMI 30-39.9)    Diverticulosis    Vitamin B12 deficiency    Vitamin D deficiency    Elevated ALT measurement    NAFLD (nonalcoholic fatty liver disease)    Reviewed  PAST SURGICAL HISTORY:        Past Surgical History:   Procedure Laterality Date    HX ADENOIDECTOMY        HX CESAREAN SECTION         x2    HX ENDOMETRIAL ABLATION        HX HYSTERECTOMY   2017     complete    HX OOPHORECTOMY              Reviewed  CURRENT MEDICATIONS:          Current Outpatient Medications   Medication Sig Dispense Refill    estradiol (VAGIFEM) vaginal tablet Insert one tablet into vagina qhs x 2 weeks and then twice a week as needed. 18 Tablet 2    multivitamin Oral Tablet Take 1 Tablet by mouth Once a day        simvastatin (ZOCOR) 20 mg  Oral Tablet Take 1 Tablet (20 mg total) by mouth Every evening 30 Tablet 0      No current facility-administered medications for this visit.    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[employer]       Comment: sp:  [employer]   Tobacco Use    Smoking status: Never    Smokeless tobacco: Never   Vaping Use    Vaping Use: Never used   Substance and Sexual Activity    Alcohol use: Yes       Comment: Social    Drug use: No    Sexual activity: Yes  Partners: Male       Birth control/protection: Vasectomy   Other Topics Concern    Abuse/Domestic Violence No    Breast Self Exam Yes    Calcium intake adequate Yes    Seat Belt Yes    Ability to Walk 1 Flight of Steps without SOB/CP Yes    Ability To Do Own ADL's Yes    Reviewed  FAMILY HISTORY:  Family Medical History:            Problem Relation (Age of Onset)     Breast Cancer Maternal Grandmother (96)     Cancer Father     Diabetes Maternal Grandfather     Heart Disease Mother (67)     Hypertension (High Blood Pressure) Mother, Father                 Reviewed      ROS  right knee pain - all others were reviewed and negative     PHYSICAL EXAM  Ht 1.626 m ('5\' 4"'$ )   Wt 93.9 kg (207 lb)   LMP 03/19/2015   BMI 35.53 kg/m                  GENERAL EXAM     Constitutional: She is oriented to person, place, and time. She appears well-developed and well-nourished.   HENT:   Head: Normocephalic and atraumatic.   Eyes: Conjunctivae are normal. Pupils are equal, round.   Neck: Normal range of motion.   Cardiovascular: Normal rate.    Pulmonary/Chest: Effort normal.   Abdominal: Non-protuberant   Neurological: She is alert and oriented to person, place, and time.   Skin: Skin is warm and dry.   Psychiatric: She has a normal mood and affect. Her behavior is  normal. Judgment and thought content normal.      Nursing note and vitals reviewed.     ORTHO EXAM   EXAM RIGHT KNEE     ROM WNL.  Medial joint tenderness to palpation.  Pain with hyperflexion and extension.  Positive pain with Mcmurray's testing.  ACL/PCL/LCL/MCL are stable.  Mild Effusion.  Patella tracks nicely without apprehension.  Quad has good tone.  Skin is normal.  Calf is soft and supple.  Neurovascular exam is normal.  Passive ROM of hip is pain free.        IMAGING      MRI imaging on the right knee was reviewed in office today and reveals a medial meniscus tear and minimal degenerative changes.        ASSESSMENT         ICD-10-CM     1. Primary osteoarthritis of right knee  M17.11         2. Tear of medial meniscus of right knee  S83.241A                  PLAN       The plan is to perform a right knee arthroscopy with partial medial meniscectomy. A date was selected in clinic today for 03/05/2022. The risks, benefits, details and non-operative alternatives were discussed with the patient. They agreed to proceed and gave informed written consent. Risks of the surgery include but are not limited to the following: cardiac arrest, death, infection, bleeding, stiffness, injury to vital structures (nerves, vessels, ligaments, tendons) permanent, disability, loss of limb, failure of surgery, deep vein thrombosis, or motor or sensory deficits.       I plan to see the patient  back 2 weeks postoperatively for re-evaluation to monitor progression and healing.  As of now, I have advised the patient to continue treating the pain conservatively with the use of ice and over-the-counter medications. The patient states understanding and is in agreement with this plan.

## 2022-03-05 NOTE — Discharge Instructions (Addendum)
Knee Arthroscopy - Patient Guide and Common Questions    Introduction:    This handout is a general guide to commonly asked questions of what to expect and general recovery information.    Arthroscopy of the Knee Joint    The arthroscope is a fiber-optic telescope that can be inserted into a joint (commonly the knee, shoulder, and ankle) to evaluate and treat a number of conditions. A camera is attached to the arthroscope and the picture is visualized on a TV monitor. Most arthroscopic surgery is performed as day surgery and is usually done under general anesthesia.    The knee joint is made up of the femur, tibia and patella (knee cap). All these bones are lined with articular cartilage. This articular cartilage acts like a shock absorber and allows a smooth low friction surface for the knee to move on. Between the tibia and femur lie two floating cartilages called menisci. The medial meniscus and the lateral meniscus rest on the tibial surface cartilage and are mobile. The menisci also act as shock absorbers and stabilizers. Ligaments that are both in and outside the joint stabilize the knee. The medial and lateral collateral ligaments support the knee from excessive side-to-side movement. The anterior and posterior cruciate ligaments support the knee from buckling and giving way. The knee joint is surrounded by a capsule that produces a small amount of synovial fluid to help with smooth motion.    A Baker's cyst can cause a lump and discomfort in the back of your knee. If there is excess joint fluid in the knee, it may leak out and collect behind the knee. When this fluid builds up behind the knee, it can form a small lump called a cyst. Knee swelling creates the most risk for developing a Baker's cyst. Common symptoms include knee pain, discomfort in the lower leg, or tightness behind the knee. Most Baker cysts go away without surgery. Sometimes a Baker's cyst will break or rupture, leaking fluid into the back  of the knee and into the calf.    Post-operative Guidelines:    A small amount of pain medication will be called to your pharmacy and should be taken as directed or you may take over the counter medication. You will take 81 mg of Aspirin twice daily until your post-operative visit. You can remove the bandage in 72 hours and shower normally. After showering, keep your incision site dry, and    apply band-aid. Do not use any ointment on the incision sites. It is NORMAL for the knee to swell after the surgery. It is also common for your surgical site to ooze fluid. Elevate the leg when seated and placing ice packs on the knee will help to reduce swelling. (Ice packs on for 20 min 3-4 times a day until swelling has reduced).    Crutches may be used as needed initially after surgery. You may put weight through your leg as tolerated unless instructed otherwise. You are strongly encouraged to move your knee as much as possible. Physical Therapy should begin 5-7 days following your surgery, unless otherwise told, to restore range of motion, strength, and normal mobility.    Contact you surgeon's office should you experience any signs of infection including increased pain, fever, chills, or decreased appetite; or any symptoms of a blood clot such as swelling / warmth in your leg or any red or discolored skin.    Script for [employer] sent to [employer] Crutches given.  Hospital Operator [phone]

## 2022-03-05 NOTE — OR Surgeon (Signed)
[facility] NOTE    Patient Name: Erica Harper, Erica Harper Number: [id number]  Date of Service: 03/05/2022   Date of Birth: [birth year]    All elements must be documented.    Pre-Operative Diagnosis:  Right knee medial meniscus tear   Post-Operative Diagnosis:  Same, near the root and posterior horn  Procedure(s)/Description:  Right knee arthroscopy partial medial meniscectomy  Findings:  As above     Attending Surgeon:  Jaci Carrel  Assistant(s): Tempie Donning, First assist      Anesthesia Type: General     Estimated Blood Loss:  Minimal  Blood Given:   Fluids Given:   Complications (not routinely expected or not inherent to difficulty/nature of procedure):  None  Characteristic Event (routinely expected or inherent to the difficulty/nature of the procedure):  None  Did the use of current and/or prior Anticoagulants impact the outcome of the case?no  Wound Class: Clean Wound: Uninfected operative wounds in which no inflammation occurred    Tubes: None  Drains: None  Specimens/ Cultures:   Implants:            Disposition: PACU - hemodynamically stable.  Condition: stable  DVT/PE Prophylaxis:  Aspirin 81 mg b.i.d.    Soundra Pilon, MD    Indications:    The patient is a 62 year old female who presented with right knee pain.  MRI and exam showed a medial meniscus tear.  I recommended right knee arthroscopy partial medial meniscectomy.  The risks, benefits, details and non-operative alternatives were discussed with the patient. They agreed to proceed and gave informed written consent. Risks of the surgery include but are not limited to the following: cardiac arrest, death, infection, bleeding, stiffness, injury to vital structures (nerves, vessels, ligaments, tendons) permanent, disability, loss of limb, failure of surgery, deep vein thrombosis, or motor or sensory deficits.    PROCEDURE NOTE:    Patient was identified by myself in the preoperative  holding area. The right knee was signed as per protocol.  The patient was then brought back to the operating room and placed on the operating room table. General anesthesia was administered per the anesthesia record.  An unsterile tourniquet was placed on the right knee. The right lower extremity was then prepped draped in position in standard fashion.  Preoperative surgical pause was carried out and everybody agreed.  Preoperative antibiotics were given. A small stab incision was made in the anterior lateral portal. The arthroscope was then placed into the joint. The patellofemoral joint was in good shape.  The medial and lateral gutters were without loose bodies.  The medial compartment was entered.  The cartilage on the medial side was in good shape.  The medial meniscus had a tear of the posterior horn, near the root.  A combination of a meniscal biter and shaver was used to trim this back to a nice stable rim.  The notch was entered the anterior cruciate ligament and posterior cruciate ligament were intact. The lateral compartment was entered.  The cartilage on the lateral side was in good shape.  The meniscus on the lateral side was in good shape without tear. Diagnostic arthroscopy pictures were taken throughout the case.  The portals were closed with a nylon stitch.  The joint was then infiltrated with local anesthetic and steroid.  A sterile dressing was applied. The tourniquet was then let down.  The patient was woken from anesthesia and placed on a gurney and wheeled to recovery room in stable condition. Postoperative course will include discharge home today.  Weightbearing as tolerated with crutch assistance.  Follow-up in the office in 2 weeks for stitch removal.  Change dressing in 3 days and keep incisions covered.  Full dose aspirin (81 mg) twice daily for deep vein thrombosis prophylaxis.

## 2022-03-07 ENCOUNTER — Ambulatory Visit (HOSPITAL_BASED_OUTPATIENT_CLINIC_OR_DEPARTMENT_OTHER): Payer: BC Managed Care – PPO | Admitting: Student in an Organized Health Care Education/Training Program

## 2022-03-20 ENCOUNTER — Encounter (HOSPITAL_BASED_OUTPATIENT_CLINIC_OR_DEPARTMENT_OTHER): Payer: Self-pay | Admitting: Sports Medicine

## 2022-03-20 ENCOUNTER — Other Ambulatory Visit: Payer: Self-pay

## 2022-03-20 ENCOUNTER — Ambulatory Visit: Payer: BC Managed Care – PPO | Attending: Sports Medicine | Admitting: Sports Medicine

## 2022-03-20 VITALS — Ht 64.0 in | Wt 201.0 lb

## 2022-03-20 DIAGNOSIS — S83241A Other tear of medial meniscus, current injury, right knee, initial encounter: Secondary | ICD-10-CM

## 2022-03-20 DIAGNOSIS — Z9889 Other specified postprocedural states: Secondary | ICD-10-CM | POA: Insufficient documentation

## 2022-03-20 DIAGNOSIS — M1711 Unilateral primary osteoarthritis, right knee: Secondary | ICD-10-CM | POA: Insufficient documentation

## 2022-03-24 NOTE — Progress Notes (Signed)
Orthopaedics, [facility]  [place] [address]  [phone]      Date: 03/20/2022  Name: Erica Harper  Age: 62 y.o.  DOB:  10/08/1960    Chief Complaint: Post Op (Right knee scope pmm)      History of Present Illness:   Erica Harper is a 62 y.o. female who presents today for postoperative evaluation. They are status post right knee arthroscopy with partial medial meniscectomy. That was performed on 03/05/2022. Patient rates their pain today as a 1 out of 10. It is increased with walking down steps and decreased with rest. Patient denies numbness and tingling today. Patient is here for further evaluation today.     Past Medical History:     Past Medical History:   Diagnosis Date    Hyperlipidemia     Rosacea              Past Surgical History:   Procedure Laterality Date    HX ADENOIDECTOMY      HX CESAREAN SECTION      x2    HX ENDOMETRIAL ABLATION      HX HYSTERECTOMY  2017    complete    HX MENISCECTOMY Right     HX OOPHORECTOMY           Family Medical History:       Problem Relation (Age of Onset)    Breast Cancer Maternal Grandmother (96)    Cancer Father    Diabetes Maternal Grandfather    Heart Disease Mother (70)    Hypertension (High Blood Pressure) Mother, Father            Social History     Tobacco Use    Smoking status: Never    Smokeless tobacco: Never   Substance Use Topics    Alcohol use: Yes     Comment: Social     Current Outpatient Medications   Medication Sig    aspirin (ECOTRIN) 81 mg Oral Tablet, Delayed Release (E.C.) Take 1 Tablet (81 mg total) by mouth Twice daily    cholecalciferol, vitamin D3, 25 mcg (1,000 unit) Oral Tablet Take 1 Tablet (1,000 Units total) by mouth Once a day    ergocalciferol, vitamin D2, (DRISDOL) 1,250 mcg (50,000 unit) Oral Capsule Take 1 Capsule (50,000 Units total) by mouth Every 7 days for 180 days    estradiol (VAGIFEM) vaginal tablet Insert one tablet into vagina qhs x 2 weeks and then twice a week as needed.     HYDROcodone-acetaminophen (NORCO) 7.5-325 mg Oral Tablet Take 1 Tablet by mouth Every 6 hours as needed for Pain    multivitamin Oral Tablet Take 1 Tablet by mouth Once a day    omega-3-DHA-EPA-fish oil (FISH OIL) 1,000 mg (120 mg-180 mg) Oral Capsule Take by mouth Once a day    simvastatin (ZOCOR) 20 mg Oral Tablet Take 1 Tablet (20 mg total) by mouth Every evening     Allergies   Allergen Reactions    Penicillins Hives/ Urticaria    Lipitor [Atorvastatin] Myalgia       Review of Systems:     right knee arthroscopy with pmm status post surgery  - ROS      Physical Examination:     Ht 1.626 m ('5\' 4"'$ )   Wt 91.2 kg (201 lb)   LMP 03/19/2015   BMI 34.50 kg/m         GENERAL: Patient is in no  acute distress. Awake, alert and oriented x 3. Mood is appropriate.  HEENT: Head is normocephalic, atraumatic. Extraocular movements are intact  NECK: Demonstrates functional range of motion. Trachea is midline.  CHEST: Demonstrates full expansion with inspiration.  HEART: Regular rate and rhythm.  ABDOMEN: Soft, Non-distended.  MUSCULOSKELETAL:   Physical Exam of the right knee:    Portals/incisions look good.  ROM WNL.  Quad tone is coming back nicely.  ACL, PCL, LCL, MCL are intact.  Patella tracks nicely.  No joint line tenderness.  Mcmurray's is negative.  Calf is soft and supple.  Normal sensation and motor in all dermatomes.  Vascular exam is normal with brisk cap refill.  Skin looks good.      Data Reviewed:   No new imaging.    Assessment:       ICD-10-CM    1. Tear of medial meniscus of right knee  S83.241A       2. Primary osteoarthritis of right knee  M17.11       3. Status post surgery  Z98.890           Plan:   I discussed my evaluation and treatment plan with Erica Harper. They are to gradually increase activity as tolerated. They will return on an as needed basis. They are to call or return sooner if they develop any new or concerning symptoms. All questions were answered to their satisfaction. They do  understand and agree with the treatment plan. Patient will follow up as directed.    No follow-ups on file.     I am scribing for, and in the presence of, Soundra Pilon, MD for services provided on 03/20/2022.  [address], [place], [place] 03/24/2022, 13:55

## 2022-05-26 ENCOUNTER — Encounter (INDEPENDENT_AMBULATORY_CARE_PROVIDER_SITE_OTHER): Payer: Self-pay | Admitting: Vascular & Interventional Radiology

## 2022-06-29 ENCOUNTER — Other Ambulatory Visit (INDEPENDENT_AMBULATORY_CARE_PROVIDER_SITE_OTHER): Payer: Self-pay | Admitting: Family Medicine

## 2022-06-29 DIAGNOSIS — N952 Postmenopausal atrophic vaginitis: Secondary | ICD-10-CM

## 2022-07-09 ENCOUNTER — Other Ambulatory Visit (INDEPENDENT_AMBULATORY_CARE_PROVIDER_SITE_OTHER): Payer: Self-pay | Admitting: Family Medicine

## 2022-07-09 DIAGNOSIS — N952 Postmenopausal atrophic vaginitis: Secondary | ICD-10-CM

## 2022-07-09 MED ORDER — ESTRADIOL 10 MCG VAGINAL TABLET
ORAL_TABLET | VAGINAL | 0 refills | Status: DC
Start: 2022-07-09 — End: 2022-10-09

## 2022-08-01 ENCOUNTER — Other Ambulatory Visit (INDEPENDENT_AMBULATORY_CARE_PROVIDER_SITE_OTHER): Payer: Self-pay | Admitting: Family Medicine

## 2022-08-01 DIAGNOSIS — N952 Postmenopausal atrophic vaginitis: Secondary | ICD-10-CM

## 2022-08-11 ENCOUNTER — Other Ambulatory Visit: Payer: Self-pay

## 2022-08-11 ENCOUNTER — Ambulatory Visit (INDEPENDENT_AMBULATORY_CARE_PROVIDER_SITE_OTHER): Payer: BC Managed Care – PPO | Admitting: Family Medicine

## 2022-08-11 ENCOUNTER — Encounter (INDEPENDENT_AMBULATORY_CARE_PROVIDER_SITE_OTHER): Payer: Self-pay

## 2022-08-11 VITALS — BP 120/82 | HR 99 | Temp 98.2°F | Resp 16 | Ht 64.0 in | Wt 208.4 lb

## 2022-08-11 DIAGNOSIS — J069 Acute upper respiratory infection, unspecified: Secondary | ICD-10-CM

## 2022-08-11 DIAGNOSIS — U071 COVID-19: Secondary | ICD-10-CM

## 2022-08-11 DIAGNOSIS — J111 Influenza due to unidentified influenza virus with other respiratory manifestations: Secondary | ICD-10-CM

## 2022-08-11 DIAGNOSIS — R6889 Other general symptoms and signs: Secondary | ICD-10-CM

## 2022-08-11 LAB — POCT COVID 4 PLEX SCREENING (AMB)
FLU A PCR: NEGATIVE
FLU B PCR: NEGATIVE
RSV BY PCR: NEGATIVE
SARS-COV-2, POC: POSITIVE — AB

## 2022-08-11 MED ORDER — BETAMETHASONE ACETATE AND SODIUM PHOS 6 MG/ML SUSPENSION FOR INJECTION
9.0000 mg | INTRAMUSCULAR | Status: AC
Start: 2022-08-11 — End: 2022-08-11
  Administered 2022-08-11: 9 mg via INTRAMUSCULAR

## 2022-08-11 NOTE — Nursing Note (Signed)
Per provider orders, I administered injection of betamethasone (Celestone)  9 mg. into the Left Gluteal  muscle using aseptic technique and a 22 g. 1" needle. Patient tolerated w/o adverse reactions, no bruising, bleeding or lasting discomfort observed or reported. Patient was given instruction on possible side effects and medication interactions and verbalized understanding.     Administrations This Visit       betamethasone (CELESTONE SOLUSPAN) 6 mg/mL injection       Admin Date  08/11/2022 Action  Given Dose  9 mg Route  IntraMUSCULAR Documented By  Percell Belt, LPN                  [employer], LPN

## 2022-08-11 NOTE — Progress Notes (Signed)
[facility]  [address]  [facility] [place] [address]       Name: Erica Harper MRN:  [id number]   Date: 08/11/2022 Age: 62 y.o.     10-Jun-1960      Chief Complaint   Patient presents with    Congestion     X 2 days   rm, 7     Sore Throat    Sinus Drainage    Sinus Pressure         HPI:  Erica Harper is a 62 y.o. year old female who  has nasal congestion, sore throat, drainage, sinus pressure x 2-3 days    Yesterday body aches, low grade fever    Her son and husband both had similar symptoms recently, they're well    She just got back from [facility] /beach      ROS:  +nasal congestion, sore throat, drainage, sinus pressure  , clear rhinitis, dry cough, body aches, fatigue, subjective low grade fever  Denies chest congestion, shortness of breath      Vitals:    08/11/22 1141   BP: 120/82   Pulse: 99   Resp: 16   Temp: 36.8 C (98.2 F)   TempSrc: Oral   SpO2: 96%   Weight: 94.5 kg (208 lb 6.4 oz)   Height: 1.626 m (5\' 4" )   BMI: 35.85        Past Medical History  Current Outpatient Medications   Medication Sig    cholecalciferol, vitamin D3, 25 mcg (1,000 unit) Oral Tablet Take 1 Tablet (1,000 Units total) by mouth Once a day    ergocalciferol, vitamin D2, (DRISDOL) 1,250 mcg (50,000 unit) Oral Capsule Take 1 Capsule (50,000 Units total) by mouth Every 7 days for 180 days    estradiol (VAGIFEM) vaginal tablet Insert one tablet into vagina qhs x 2 weeks and then twice a week as needed.    HYDROcodone-acetaminophen (NORCO) 7.5-325 mg Oral Tablet Take 1 Tablet by mouth Every 6 hours as needed for Pain    multivitamin Oral Tablet Take 1 Tablet by mouth Once a day    omega-3-DHA-EPA-fish oil (FISH OIL) 1,000 mg (120 mg-180 mg) Oral Capsule Take by mouth Once a day    simvastatin (ZOCOR) 20 mg Oral Tablet Take 1 Tablet (20 mg total) by mouth Every evening     Allergies   Allergen Reactions    Penicillins Hives/ Urticaria    Lipitor [Atorvastatin] Myalgia     Past Medical History:   Diagnosis Date     Hyperlipidemia     Rosacea          Past Surgical History:   Procedure Laterality Date    HX ADENOIDECTOMY      HX CESAREAN SECTION      x2    HX ENDOMETRIAL ABLATION      HX HYSTERECTOMY  2017    complete    HX MENISCECTOMY Right     HX OOPHORECTOMY           Family Medical History:       Problem Relation (Age of Onset)    Breast Cancer Maternal Grandmother (96)    Cancer Father    Diabetes Maternal Grandfather    Heart Disease Mother (63)    Hypertension (High Blood Pressure) Mother, Father            Social History     Socioeconomic History    Marital status: Married   Occupational History  Employer: [employer]     Comment: kindergarten aide Erica Harper     Comment: sp:  Erica Harper   Tobacco Use    Smoking status: Never    Smokeless tobacco: Never   Vaping Use    Vaping status: Never Used   Substance and Sexual Activity    Alcohol use: Yes     Comment: Social    Drug use: No    Sexual activity: Yes     Partners: Male     Birth control/protection: Vasectomy   Other Topics Concern    Abuse/Domestic Violence No    Breast Self Exam Yes    Calcium intake adequate Yes    Seat Belt Yes    Ability to Walk 1 Flight of Steps without SOB/CP Yes    Ability To Do Own ADL's Yes     Patient Active Problem List   Diagnosis    Atrophic vaginitis    Hyperlipidemia LDL goal <100    Venous insufficiency of both lower extremities    Hepatic cyst    Gallbladder polyp    Obesity (BMI 30-39.9)    Diverticulosis    Vitamin B12 deficiency    Vitamin D deficiency    Elevated ALT measurement    NAFLD (nonalcoholic fatty liver disease)       PE:   WNWD, not ill appearing, NAD. Afebrile. Mucousa pink and moist. Inferior turbinates erythematous bilaterally. Nares are patent. Posterior pharynx has injection, erythema and cobblestoning  without tonsillitis or exudate. Neck supple without adenopathy.  Chest is clear to ausculation with a normal I:E ratio- no focal changes, no wheezing, rhonchi or rales .Normal  breathing/respirations. Heart is RRR without m/r/g. No rashes/lesions      Assessment:  Erica Harper was seen today for congestion, sore throat, sinus drainage and sinus pressure.    Diagnoses and all orders for this visit:    COVID    Viral upper respiratory tract infection with cough  -     PCR COVID-19 4 Plex Screening, POCT (AMB Only)    Influenza-like illness  -     PCR COVID-19 4 Plex Screening, POCT (AMB Only)    Other orders  -     betamethasone (CELESTONE SOLUSPAN) 6 mg/mL injection           Nursing Notes:   Erica Schneiders, LPN  16/10/96 0454  Signed  Per provider orders, I administered injection of betamethasone (Celestone)  9 mg. into the Left Gluteal  muscle using aseptic technique and a 22 g. 1" needle. Patient tolerated w/o adverse reactions, no bruising, bleeding or lasting discomfort observed or reported. Patient was given instruction on possible side effects and medication interactions and verbalized understanding.     Administrations This Visit       betamethasone (CELESTONE SOLUSPAN) 6 mg/mL injection       Admin Date  08/11/2022 Action  Given Dose  9 mg Route  IntraMUSCULAR Documented By  Percell Belt, LPN                  [employer], LPN      Dene Gentry, MA  08/11/22 1418  Signed  Rapid COVID Screening  No results found for this visit on 08/11/22.    PCR Covid Screening  Results for orders placed or performed in visit on 08/11/22   PCR COVID-19 4 Plex Screening, POCT (AMB Only)   Result Value Ref Range    SARS-COV-2, POC Positive (A) Negative  FLU A PCR Negative Negative    FLU B PCR Negative Negative    RSV BY PCR Negative Negative       Plan    4 plex positive for covid.  Negative for flu a/b, rsv      Vitamin D/c, zinc    Let us know if signs of secondary infection as discussed    Followup if symptoms worsen, change or persist    Desma Paganini, PA-C

## 2022-08-11 NOTE — Nursing Note (Signed)
Rapid COVID Screening  No results found for this visit on 08/11/22.    PCR Covid Screening  Results for orders placed or performed in visit on 08/11/22   PCR COVID-19 4 Plex Screening, POCT (AMB Only)   Result Value Ref Range    SARS-COV-2, POC Positive (A) Negative    FLU A PCR Negative Negative    FLU B PCR Negative Negative    RSV BY PCR Negative Negative

## 2022-08-11 NOTE — Nursing Note (Deleted)
Rapid COVID Screening  No results found for this visit on 08/11/22.    PCR Covid Screening  Results for orders placed or performed in visit on 08/11/22   PCR COVID-19 4 Plex Screening, POCT (AMB Only)   Result Value Ref Range    SARS-COV-2, POC Negative Negative    FLU A PCR Negative Negative    FLU B PCR Negative Negative    RSV BY PCR Negative Negative

## 2022-09-12 ENCOUNTER — Ambulatory Visit (HOSPITAL_BASED_OUTPATIENT_CLINIC_OR_DEPARTMENT_OTHER): Payer: BC Managed Care – PPO | Admitting: Student in an Organized Health Care Education/Training Program

## 2022-09-13 ENCOUNTER — Other Ambulatory Visit (INDEPENDENT_AMBULATORY_CARE_PROVIDER_SITE_OTHER): Payer: Self-pay | Admitting: Family Medicine

## 2022-09-13 DIAGNOSIS — N952 Postmenopausal atrophic vaginitis: Secondary | ICD-10-CM

## 2022-09-15 ENCOUNTER — Other Ambulatory Visit (HOSPITAL_COMMUNITY): Payer: Self-pay

## 2022-09-15 DIAGNOSIS — Z1231 Encounter for screening mammogram for malignant neoplasm of breast: Secondary | ICD-10-CM

## 2022-09-23 ENCOUNTER — Inpatient Hospital Stay
Admission: RE | Admit: 2022-09-23 | Discharge: 2022-09-23 | Disposition: A | Discharge status: Home / Self Care | Payer: BC Managed Care – PPO | Source: Ambulatory Visit

## 2022-09-23 ENCOUNTER — Other Ambulatory Visit: Payer: Self-pay

## 2022-09-23 ENCOUNTER — Encounter (HOSPITAL_COMMUNITY): Payer: Self-pay

## 2022-09-23 DIAGNOSIS — Z1231 Encounter for screening mammogram for malignant neoplasm of breast: Secondary | ICD-10-CM | POA: Insufficient documentation

## 2022-09-29 ENCOUNTER — Other Ambulatory Visit (INDEPENDENT_AMBULATORY_CARE_PROVIDER_SITE_OTHER): Payer: Self-pay | Admitting: Family Medicine

## 2022-10-06 ENCOUNTER — Other Ambulatory Visit (INDEPENDENT_AMBULATORY_CARE_PROVIDER_SITE_OTHER): Payer: Self-pay | Admitting: Family Medicine

## 2022-10-06 ENCOUNTER — Telehealth (INDEPENDENT_AMBULATORY_CARE_PROVIDER_SITE_OTHER): Payer: Self-pay | Admitting: Family Medicine

## 2022-10-06 NOTE — Telephone Encounter (Signed)
I have not seen this patient since 10/2021. I keep receiving refill requests. Please call the patient and let her know she needs to schedule a f/up.

## 2022-10-06 NOTE — Telephone Encounter (Signed)
Patient scheduled for Thursday

## 2022-10-09 ENCOUNTER — Ambulatory Visit (INDEPENDENT_AMBULATORY_CARE_PROVIDER_SITE_OTHER): Payer: BC Managed Care – PPO | Admitting: Family Medicine

## 2022-10-09 ENCOUNTER — Encounter (INDEPENDENT_AMBULATORY_CARE_PROVIDER_SITE_OTHER): Payer: Self-pay | Admitting: Family Medicine

## 2022-10-09 ENCOUNTER — Other Ambulatory Visit (INDEPENDENT_AMBULATORY_CARE_PROVIDER_SITE_OTHER): Payer: Self-pay | Admitting: Family Medicine

## 2022-10-09 VITALS — BP 138/86 | HR 86 | Temp 97.6°F | Resp 18 | Ht 64.0 in | Wt 211.0 lb

## 2022-10-09 DIAGNOSIS — N952 Postmenopausal atrophic vaginitis: Secondary | ICD-10-CM

## 2022-10-09 DIAGNOSIS — Z0000 Encounter for general adult medical examination without abnormal findings: Secondary | ICD-10-CM

## 2022-10-09 DIAGNOSIS — E785 Hyperlipidemia, unspecified: Secondary | ICD-10-CM

## 2022-10-09 MED ORDER — SIMVASTATIN 20 MG TABLET
20.0000 mg | ORAL_TABLET | Freq: Every evening | ORAL | 0 refills | Status: DC
Start: 2022-10-09 — End: 2022-10-13

## 2022-10-09 MED ORDER — ESTRADIOL 10 MCG VAGINAL TABLET
ORAL_TABLET | VAGINAL | 2 refills | Status: DC
Start: 2022-10-09 — End: 2023-09-30

## 2022-10-09 NOTE — Progress Notes (Addendum)
[facility] FAMILY MED  [address] [facility] [address]        Encounter Date: 10/09/2022  2:15 PM EDT      Name: Erica Harper  Age: 62 y.o.  DOB: [birth year]  Sex: female    Chief Complaint:   Chief Complaint   Patient presents with    Annual Exam       HPI  Patient is here for her annual physical exam.  She did go see a nurse practitioner earlier this summer in had a mammogram conducted earlier this month.  Patient does not have any pelvic pain.  No vaginal bleeding.  She does have a history of atrophic vaginitis and would like a refill of Vagifem.    Patient did inquire about weight loss injections.  She called her insurance and told that Ozempic was covered for weight loss.  Patient does not have a history of diabetes.  She is going to get labs at a local health fair this coming weekend    History   Family Medical History:       Problem Relation (Age of Onset)    Breast Cancer Maternal Grandmother (96)    Cancer Father    Diabetes Maternal Grandfather    Heart Disease Mother (53)    Hypertension (High Blood Pressure) Mother, Father          Past Medical History:   Diagnosis Date    Rosacea        Past Surgical History:   Procedure Laterality Date    HX ADENOIDECTOMY      HX CESAREAN SECTION      x2    HX ENDOMETRIAL ABLATION      HX HYSTERECTOMY  2017    complete    HX MENISCECTOMY Right 02/2022    Dr. Arelia Longest OOPHORECTOMY         Patient Active Problem List    Diagnosis    Elevated ALT measurement    NAFLD (nonalcoholic fatty liver disease)    Vitamin B12 deficiency    Vitamin D deficiency    Hepatic cyst    Gallbladder polyp    Obesity (BMI 30-39.9)    Diverticulosis    Venous insufficiency of both lower extremities    Hyperlipidemia LDL goal <100    Atrophic vaginitis     Current Outpatient Medications   Medication Sig    estradiol (VAGIFEM) vaginal tablet Insert one tablet into vagina qhs x 2 weeks and then twice a week as needed.    multivitamin Oral Tablet Take 1 Tablet by mouth Once a day     omega-3-DHA-EPA-fish oil (FISH OIL) 1,000 mg (120 mg-180 mg) Oral Capsule Take by mouth Once a day    simvastatin (ZOCOR) 20 mg Oral Tablet Take 1 Tablet (20 mg total) by mouth Every evening     Social History     Tobacco Use   Smoking Status Never   Smokeless Tobacco Never     Social History     Substance and Sexual Activity   Alcohol Use Yes    Comment: Social     Social History     Substance and Sexual Activity   Drug Use No       Review of Systems   Constitutional:  Negative for chills and fever.   HENT:  Negative for congestion and trouble swallowing.    Eyes:  Negative for visual disturbance.   Respiratory:  Negative for cough  and shortness of breath.    Cardiovascular:  Negative for chest pain and palpitations.   Gastrointestinal:  Negative for abdominal pain, blood in stool and nausea.   Endocrine: Negative for cold intolerance and heat intolerance.   Genitourinary:  Negative for dysuria and pelvic pain.   Musculoskeletal:  Positive for arthralgias. Negative for gait problem.        Patient states that her right knee pain has improved after her meniscus surgery earlier this year   Skin:  Negative for rash.   Allergic/Immunologic:        No recurrent infections   Neurological:  Negative for dizziness and weakness.   Hematological:  Does not bruise/bleed easily.   Psychiatric/Behavioral:  Negative for dysphoric mood. The patient is not nervous/anxious.         Patient feels safe at home.  She is retired.  She enjoys baby-sitting for her granddaughter        Examination  Vitals: BP 138/86   Pulse 86   Temp 36.4 C (97.6 F)   Resp 18   Ht 1.626 m (5\' 4" )   Wt 95.7 kg (211 lb)   LMP 03/19/2015   SpO2 97%   BMI 36.22 kg/m         Physical Exam  Vitals and nursing note reviewed.   Constitutional:       General: She is not in acute distress.     Appearance: Normal appearance. She is well-developed.   HENT:      Head: Normocephalic and atraumatic.      Nose: Nose normal.      Mouth/Throat:      Mouth:  Mucous membranes are moist.      Pharynx: Oropharynx is clear.   Eyes:      Extraocular Movements: Extraocular movements intact.      Conjunctiva/sclera: Conjunctivae normal.      Pupils: Pupils are equal, round, and reactive to light.   Cardiovascular:      Rate and Rhythm: Normal rate and regular rhythm.      Pulses: Normal pulses.      Comments: 2+ posterior tibial pulses bilaterally  Pulmonary:      Effort: Pulmonary effort is normal. No respiratory distress.      Breath sounds: Normal breath sounds. No wheezing.   Abdominal:      General: Bowel sounds are normal. There is no distension.      Palpations: Abdomen is soft.      Tenderness: There is no abdominal tenderness.   Musculoskeletal:      Cervical back: Normal range of motion and neck supple.      Right lower leg: No edema.      Left lower leg: No edema.      Comments: Normal gait.  No tremor in upper extremities bilaterally   Skin:     General: Skin is warm and dry.   Neurological:      Mental Status: She is alert and oriented to person, place, and time.      Cranial Nerves: No cranial nerve deficit.   Psychiatric:         Mood and Affect: Mood normal.         Behavior: Behavior normal.         Thought Content: Thought content normal.         Judgment: Judgment normal.         Assessment and Plan  Anneelizabeth was seen today for annual exam.  Physical exam, annual  BMI addressed: Advised on diet, weight loss, and exercise to reduce above normal BMI.  Patient was given contact information for a local nutritionist.  Explain to her that Ozempic is or diabetics and she can have an A1c checked when she has her labs conducted at the local health fair this weekend.  However, Ozempic can not prescribed for weight loss.      Atrophic vaginitis  Refilled the following medication  -     estradiol (VAGIFEM) vaginal tablet; Insert one tablet into vagina qhs x 2 weeks and then twice a week as needed.    Hyperlipidemia LDL goal <100  Patient is well tolerating.  Sent a  refill for the Zocor for 30 days until labs.  -     simvastatin (ZOCOR) 20 mg Oral Tablet; Take 1 Tablet (20 mg total) by mouth Every evening    Patient can come in once a year for her annual physical.  However, she will need labs every 6 months due to being on a statin.    Return in about 1 year (around 10/09/2023) for physical.    Eustace Moore, MD

## 2022-10-13 ENCOUNTER — Other Ambulatory Visit (INDEPENDENT_AMBULATORY_CARE_PROVIDER_SITE_OTHER): Payer: Self-pay | Admitting: Family Medicine

## 2022-10-13 MED ORDER — SIMVASTATIN 20 MG TABLET
20.0000 mg | ORAL_TABLET | Freq: Every evening | ORAL | 0 refills | Status: DC
Start: 2022-10-13 — End: 2022-10-29

## 2022-10-13 NOTE — Telephone Encounter (Signed)
Pt is requesting simvastatin 20 mg oral tab be sent to [facility] in [place].     CB: [phone]

## 2022-10-23 ENCOUNTER — Telehealth (INDEPENDENT_AMBULATORY_CARE_PROVIDER_SITE_OTHER): Payer: Self-pay | Admitting: Family Medicine

## 2022-10-23 DIAGNOSIS — D582 Other hemoglobinopathies: Secondary | ICD-10-CM

## 2022-10-23 NOTE — Telephone Encounter (Signed)
Informed patient

## 2022-10-23 NOTE — Telephone Encounter (Signed)
[facility]  Patient had labs at a recent health fair  1. Hemoglobin and hematocrit  are abnormal along with multiple other abnormalities in the CBC.  This will also seen on another patient who had labs at the same place.  This is most likely a lab error.  However, I would like her to recheck her CBC in 2-3 weeks  2. Chem 14 is grossly within normal limits  3. LDL is 105 and should be less than 100.  She is almost at goal.  Continue the Zocor 20 mg daily and decrease fatty food intake to help get this below 100

## 2022-10-29 ENCOUNTER — Other Ambulatory Visit (INDEPENDENT_AMBULATORY_CARE_PROVIDER_SITE_OTHER): Payer: Self-pay | Admitting: Family Medicine

## 2023-01-07 ENCOUNTER — Encounter (INDEPENDENT_AMBULATORY_CARE_PROVIDER_SITE_OTHER): Payer: Self-pay | Admitting: Emergency Medicine

## 2023-01-07 ENCOUNTER — Other Ambulatory Visit: Payer: Self-pay

## 2023-01-07 ENCOUNTER — Ambulatory Visit (INDEPENDENT_AMBULATORY_CARE_PROVIDER_SITE_OTHER): Payer: BC Managed Care – PPO | Admitting: Emergency Medicine

## 2023-01-07 VITALS — BP 114/82 | HR 76 | Resp 16 | Ht 64.0 in | Wt 215.2 lb

## 2023-01-07 DIAGNOSIS — K760 Fatty (change of) liver, not elsewhere classified: Secondary | ICD-10-CM

## 2023-01-07 DIAGNOSIS — Z6836 Body mass index (BMI) 36.0-36.9, adult: Secondary | ICD-10-CM

## 2023-01-07 DIAGNOSIS — E669 Obesity, unspecified: Secondary | ICD-10-CM

## 2023-01-07 DIAGNOSIS — N952 Postmenopausal atrophic vaginitis: Secondary | ICD-10-CM

## 2023-01-07 DIAGNOSIS — E785 Hyperlipidemia, unspecified: Secondary | ICD-10-CM

## 2023-01-07 DIAGNOSIS — Z2821 Immunization not carried out because of patient refusal: Secondary | ICD-10-CM

## 2023-01-07 MED ORDER — WEGOVY 0.25 MG/0.5 ML SUBCUTANEOUS PEN INJECTOR
0.2500 mg | PEN_INJECTOR | SUBCUTANEOUS | 0 refills | Status: DC
Start: 2023-01-07 — End: 2023-03-04

## 2023-01-07 NOTE — Progress Notes (Signed)
[facility]  [address]  [place] [address]  Dept Phone: [phone]  Dept Fax: [phone]    Erica Harper  1960-03-20  [id number]    Date of Service: 01/07/2023     Chief complaint:   Chief Complaint   Patient presents with    New Patient     Establish care         Subjective:     She comes in today for New Patient (Establish care).    Here to see me today to get established.   Interested in weight loss medication. Has a hard time exercising due to knee injuries.  No history of pancreatitis, no personal or family history of medullary thyroid cancer, no personal or family history of MENS.   Vaginal atrophy: on vagifem. Had hysterectomy for adenomyoma.   HLD: doing well on Zocor.       Review of Systems   Constitutional:  Negative for chills and fever.   Eyes:  Negative for visual disturbance.   Respiratory:  Negative for chest tightness and shortness of breath.    Cardiovascular:  Negative for chest pain, palpitations and leg swelling.   Gastrointestinal:  Negative for abdominal pain.   Neurological:  Negative for headaches.       Patient Active Problem List    Diagnosis    Elevated ALT measurement    NAFLD (nonalcoholic fatty liver disease)    Vitamin B12 deficiency    Vitamin D deficiency    Hepatic cyst    Gallbladder polyp    Obesity (BMI 30-39.9)    Diverticulosis    Venous insufficiency of both lower extremities    Hyperlipidemia LDL goal <100    Atrophic vaginitis     Family Medical History:       Problem Relation (Age of Onset)    Breast Cancer Maternal Grandmother (96)    Cancer Father    Diabetes Maternal Grandfather    Heart Disease Mother (40)    Hypertension (High Blood Pressure) Mother, Father            Social History     Socioeconomic History    Marital status: Married    Number of children: 2   Occupational History     Employer: [employer]     Comment: kindergarten aide [employer]     Comment: sp:  [employer]   Tobacco Use    Smoking status: Never     Smokeless tobacco: Never   Vaping Use    Vaping status: Never Used   Substance and Sexual Activity    Alcohol use: Yes     Comment: Social    Drug use: No    Sexual activity: Yes     Partners: Male     Birth control/protection: Vasectomy   Other Topics Concern    Abuse/Domestic Violence No    Breast Self Exam Yes    Calcium intake adequate Yes    Seat Belt Yes    Ability to Walk 1 Flight of Steps without SOB/CP Yes    Ability To Do Own ADL's Yes     Past Surgical History:   Procedure Laterality Date    COLONOSCOPY N/A 09/01/2016    Performed by Elliot Gault, MD at [facility] OR ENDO    HX ADENOIDECTOMY      HX CESAREAN SECTION      x2    HX ENDOMETRIAL ABLATION  HX HYSTERECTOMY  2017    complete    HX MENISCECTOMY Right 02/2022    Dr. Ruthell Rummage    HX OOPHORECTOMY      HX TONSIL AND ADENOIDECTOMY      HX WISDOM TEETH EXTRACTION      RIGHT KNEE ARTHROSCOPY WITH PARTIAL MEDIAL MENISCECTOMY Right 03/05/2022    Performed by Duard Larsen, MD at [facility] OR MAIN    RIGHT LEG ENDOVENOUS ABLATION WITH PHLEBECTOMY Right 02/01/2021    Performed by Garfield Cornea, MD FACS at [facility] OR MAIN     Current Outpatient Medications   Medication Sig    ergocalciferol, vitamin D2, (DRISDOL) 1,250 mcg (50,000 unit) Oral Capsule Take 1 Capsule (50,000 Units total) by mouth Twice daily    estradiol (VAGIFEM) vaginal tablet Insert one tablet into vagina qhs x 2 weeks and then twice a week as needed.    simvastatin (ZOCOR) 20 mg Oral Tablet Take 1 Tablet (20 mg total) by mouth Every evening She can only have a 30 day supply until labs       Objective:   BP 114/82   Pulse 76   Resp 16   Ht 1.626 m (5\' 4" )   Wt 97.6 kg (215 lb 3.2 oz)   LMP 03/19/2015   SpO2 98%   BMI 36.94 kg/m       Physical Exam  Constitutional:       Appearance: Normal appearance. She is normal weight.   HENT:      Head: Normocephalic.      Right Ear: Tympanic membrane and ear canal normal.      Left Ear: Tympanic membrane and ear canal normal.      Mouth/Throat:       Mouth: Mucous membranes are moist.   Eyes:      General: No scleral icterus.  Cardiovascular:      Rate and Rhythm: Normal rate and regular rhythm.      Heart sounds: Normal heart sounds. No murmur heard.  Pulmonary:      Effort: Pulmonary effort is normal. No respiratory distress.      Breath sounds: Normal breath sounds. No wheezing, rhonchi or rales.   Abdominal:      General: Bowel sounds are normal.      Palpations: Abdomen is soft. There is no mass.      Tenderness: There is no abdominal tenderness. There is no guarding or rebound.   Musculoskeletal:      Cervical back: Neck supple.      Right lower leg: No edema.      Left lower leg: No edema.   Skin:     General: Skin is warm.   Neurological:      General: No focal deficit present.      Mental Status: She is alert.   Psychiatric:         Mood and Affect: Mood normal.       Health Maintenance     Health Maintenance Due   Topic Date Due    HIV Screening  Never done    Shingles Vaccine (1 of 2) Never done    Influenza Vaccine (1) 10/19/2022       Assessment and Plan     Erica Harper was seen today for new patient.    Diagnoses and all orders for this visit:    Hyperlipidemia LDL goal <100        - Managed on Zocor. Will obtain labs from lab fair.  Obesity (BMI 35.0-39.9 without comorbidity)  -     semaglutide, weight loss, (WEGOVY) 0.25 mg/0.5 mL Subcutaneous Pen Injector; Inject 0.5 mL (0.25 mg total) under the skin Every 7 days for 30 days  - Discussed medication side effects.  Discussed side effects of nausea, vomiting, diarrhea, constipation.  Discussed the reported side effect of gastroparesis.  Patient is willing to proceed with medication.  Medication sent to pharmacy.    NAFLD (nonalcoholic fatty liver disease)        - discussed the need for diet and exercise.  Patient is hopeful with the weight loss as we will help her fatty liver disease.    Atrophic vaginitis         - managed on Vagifem.  Patient has done well with this medication.  Continue.  She is  status post hysterectomy.    Flu vaccine refused         Follow up     Follow up:  Return in about 8 weeks (around 03/04/2023) for weight management.    Noel Gerold, DO

## 2023-01-21 ENCOUNTER — Other Ambulatory Visit (INDEPENDENT_AMBULATORY_CARE_PROVIDER_SITE_OTHER): Payer: Self-pay | Admitting: Family Medicine

## 2023-01-21 DIAGNOSIS — N952 Postmenopausal atrophic vaginitis: Secondary | ICD-10-CM

## 2023-02-26 ENCOUNTER — Encounter (INDEPENDENT_AMBULATORY_CARE_PROVIDER_SITE_OTHER): Payer: Self-pay | Admitting: Emergency Medicine

## 2023-02-28 ENCOUNTER — Other Ambulatory Visit (HOSPITAL_COMMUNITY): Payer: Self-pay

## 2023-02-28 DIAGNOSIS — IMO0001 Reserved for inherently not codable concepts without codable children: Secondary | ICD-10-CM

## 2023-02-28 LAB — CBC WITH DIFF
BASOPHIL #: <0.1 10*3/uL (ref <=0.20)
BASOPHIL %: 0.8 %
EOSINOPHIL #: 0.27 10*3/uL (ref <=0.50)
EOSINOPHIL %: 4.4 %
HCT: 42.4 % (ref 34.8–46.0)
HGB: 13.5 g/dL (ref 11.5–16.0)
IMMATURE GRANULOCYTE #: <0.1 10*3/uL (ref <0.10)
IMMATURE GRANULOCYTE %: 0.2 % (ref 0.0–1.0)
LYMPHOCYTE #: 1.96 10*3/uL (ref 1.00–4.80)
LYMPHOCYTE %: 32 %
MCH: 28.5 pg (ref 26.0–32.0)
MCHC: 31.8 g/dL (ref 31.0–35.5)
MCV: 89.6 fL (ref 78.0–100.0)
MONOCYTE #: 0.59 10*3/uL (ref 0.20–1.10)
MONOCYTE %: 9.6 %
MPV: 10.6 fL (ref 8.7–12.5)
NEUTROPHIL #: 3.24 10*3/uL (ref 1.50–7.70)
NEUTROPHIL %: 53 %
PLATELETS: 289 10*3/uL (ref 150–400)
RBC: 4.73 10*6/uL (ref 3.85–5.22)
RDW-CV: 14.5 % (ref 11.5–15.5)
WBC: 6.1 10*3/uL (ref 3.7–11.0)

## 2023-02-28 LAB — COMPREHENSIVE METABOLIC PANEL, NON-FASTING
ALBUMIN: 3.7 g/dL (ref 3.4–4.8)
ALKALINE PHOSPHATASE: 83 U/L (ref 50–130)
ALT (SGPT): 23 U/L — ABNORMAL HIGH (ref 8–22)
ANION GAP: 8 mmol/L (ref 4–13)
AST (SGOT): 20 U/L (ref 8–45)
BILIRUBIN TOTAL: 0.4 mg/dL (ref 0.3–1.3)
BUN/CREA RATIO: 18 (ref 6–22)
BUN: 16 mg/dL (ref 8–25)
CALCIUM: 9.3 mg/dL (ref 8.6–10.3)
CHLORIDE: 108 mmol/L (ref 96–111)
CO2 TOTAL: 25 mmol/L (ref 23–31)
CREATININE: 0.89 mg/dL (ref 0.60–1.05)
ESTIMATED GFR - FEMALE: 74 mL/min/BSA (ref >=60)
GLUCOSE: 82 mg/dL (ref 65–125)
POTASSIUM: 4.4 mmol/L (ref 3.5–5.1)
PROTEIN TOTAL: 7.4 g/dL (ref 6.0–8.0)
SODIUM: 141 mmol/L (ref 136–145)

## 2023-02-28 LAB — LIPID PANEL
CHOL/HDL RATIO: 2.3
CHOLESTEROL: 190 mg/dL (ref 100–200)
HDL CHOL: 83 mg/dL (ref >=50)
LDL CALC: 99 mg/dL (ref <100)
NON-HDL: 107 mg/dL (ref <=190)
TRIGLYCERIDES: 42 mg/dL (ref <150)
VLDL CALC: 7 mg/dL (ref <30)

## 2023-02-28 LAB — URIC ACID: URIC ACID: 4.8 mg/dL (ref 2.9–6.3)

## 2023-02-28 LAB — MAGNESIUM: MAGNESIUM: 2.3 mg/dL (ref 1.8–2.6)

## 2023-02-28 LAB — PHOSPHORUS: PHOSPHORUS: 3.3 mg/dL (ref 2.3–4.0)

## 2023-03-04 ENCOUNTER — Encounter (INDEPENDENT_AMBULATORY_CARE_PROVIDER_SITE_OTHER): Payer: Self-pay | Admitting: Emergency Medicine

## 2023-03-04 ENCOUNTER — Other Ambulatory Visit (INDEPENDENT_AMBULATORY_CARE_PROVIDER_SITE_OTHER): Payer: Self-pay | Admitting: Emergency Medicine

## 2023-03-04 ENCOUNTER — Ambulatory Visit (INDEPENDENT_AMBULATORY_CARE_PROVIDER_SITE_OTHER): Payer: BC Managed Care – PPO | Admitting: Emergency Medicine

## 2023-03-04 ENCOUNTER — Other Ambulatory Visit: Payer: Self-pay

## 2023-03-04 VITALS — BP 114/82 | HR 73 | Resp 16 | Ht 64.0 in | Wt 215.0 lb

## 2023-03-04 DIAGNOSIS — Z6836 Body mass index (BMI) 36.0-36.9, adult: Secondary | ICD-10-CM

## 2023-03-04 DIAGNOSIS — E669 Obesity, unspecified: Secondary | ICD-10-CM

## 2023-03-04 DIAGNOSIS — K760 Fatty (change of) liver, not elsewhere classified: Secondary | ICD-10-CM

## 2023-03-04 MED ORDER — ZEPBOUND 2.5 MG/0.5 ML SUBCUTANEOUS PEN INJECTOR
2.5000 mg | PEN_INJECTOR | SUBCUTANEOUS | 0 refills | Status: DC
Start: 2023-03-04 — End: 2023-10-28

## 2023-03-04 NOTE — Progress Notes (Signed)
[facility]  [address]  [place] [address]  Dept Phone: [phone]  Dept Fax: [phone]    Erica Harper  18-May-1960  [id number]    Date of Service: 03/04/2023     Chief complaint:   Chief Complaint   Patient presents with    Follow Up     Weight management          Subjective:     She comes in today for Follow Up (Weight management ).    She is here for follow up after initiating weight loss medication.  Unfortunately her insurance did not cover any of the weight loss medication and it was going to cost her 1600 dollars out of pocket.  Therefore she did not pick up the medication.  She overall is doing well otherwise without any complaints.    Review of Systems   Constitutional:  Negative for chills and fever.   Eyes:  Negative for visual disturbance.   Respiratory:  Negative for chest tightness and shortness of breath.    Cardiovascular:  Negative for chest pain and palpitations.   Gastrointestinal:  Negative for abdominal pain.         Current Outpatient Medications   Medication Sig    ergocalciferol, vitamin D2, (DRISDOL) 1,250 mcg (50,000 unit) Oral Capsule Take 1 Capsule (50,000 Units total) by mouth Once a day    estradiol (VAGIFEM) vaginal tablet Insert one tablet into vagina qhs x 2 weeks and then twice a week as needed.    simvastatin (ZOCOR) 20 mg Oral Tablet Take 1 Tablet (20 mg total) by mouth Every evening She can only have a 30 day supply until labs         Objective:   BP 114/82   Pulse 73   Resp 16   Ht 1.626 m (5\' 4" )   Wt 97.5 kg (215 lb)   LMP 03/19/2015   SpO2 96%   BMI 36.90 kg/m         Physical Exam  Constitutional:       General: She is not in acute distress.     Appearance: Normal appearance. She is obese. She is not ill-appearing.   HENT:      Head: Normocephalic.      Right Ear: Tympanic membrane and ear canal normal.      Left Ear: Tympanic membrane and ear canal normal.      Mouth/Throat:      Mouth: Mucous membranes are moist.   Eyes:       General: No scleral icterus.  Cardiovascular:      Rate and Rhythm: Normal rate and regular rhythm.      Heart sounds: Normal heart sounds. No murmur heard.  Pulmonary:      Effort: Pulmonary effort is normal. No respiratory distress.      Breath sounds: Normal breath sounds. No wheezing, rhonchi or rales.   Abdominal:      General: Abdomen is flat. Bowel sounds are normal.      Palpations: Abdomen is soft. There is no mass.      Tenderness: There is no abdominal tenderness. There is no guarding or rebound.   Musculoskeletal:      Cervical back: Neck supple.      Right lower leg: No edema.      Left lower leg: No edema.   Skin:     General: Skin is warm.   Neurological:      General:  No focal deficit present.      Mental Status: She is alert.   Psychiatric:         Mood and Affect: Mood normal.         Health Maintenance     Health Maintenance Due   Topic Date Due    HIV Screening  Never done    Shingles Vaccine (1 of 2) Never done    Pneumococcal Vaccination, Age 25+ (1 of 1 - PCV) Never done    Influenza Vaccine (1) 10/19/2022       Assessment and Plan     Erica Harper was seen today for follow up.    Diagnoses and all orders for this visit:    Obesity (BMI 35.0-39.9 without comorbidity)        - insurance would not cover medication.  Did trial of zepbound to see if it was any different.  Awaiting determination.  We will update patient once we know more.  Continue to watch diet exercise    NAFLD (nonalcoholic fatty liver disease)         Follow up     Follow up:  Return Once medication initiated.    Noel Gerold, DO

## 2023-03-04 NOTE — Telephone Encounter (Signed)
Initiated prior auth through [employer] for [employer].  States Your patient will pay 100% of a discounted price for this medication. Any amount the patient pays will not apply to their deductible or out-of-pocket expenses.  Called and spoke to patient. Advised of the above message.  She wants to try zepbound.    Randie Heinz, LPN

## 2023-03-05 ENCOUNTER — Telehealth (INDEPENDENT_AMBULATORY_CARE_PROVIDER_SITE_OTHER): Payer: Self-pay | Admitting: Emergency Medicine

## 2023-03-05 ENCOUNTER — Encounter (INDEPENDENT_AMBULATORY_CARE_PROVIDER_SITE_OTHER): Payer: Self-pay | Admitting: Emergency Medicine

## 2023-03-05 NOTE — Telephone Encounter (Signed)
Patient states wegovy would cost over $1600.  Sent in zepbound.  Initiated prior auth for zepbound through covermymeds.  Insurance does not cover zepbound.      Randie Heinz, LPN

## 2023-03-06 ENCOUNTER — Encounter (INDEPENDENT_AMBULATORY_CARE_PROVIDER_SITE_OTHER): Payer: Self-pay

## 2023-06-21 ENCOUNTER — Other Ambulatory Visit (INDEPENDENT_AMBULATORY_CARE_PROVIDER_SITE_OTHER): Payer: Self-pay | Admitting: Family Medicine

## 2023-07-27 ENCOUNTER — Other Ambulatory Visit (INDEPENDENT_AMBULATORY_CARE_PROVIDER_SITE_OTHER): Payer: Self-pay | Admitting: Emergency Medicine

## 2023-07-27 MED ORDER — SIMVASTATIN 20 MG TABLET: 20.0000 mg | ORAL_TABLET | Freq: Every evening | ORAL | 1 refills | Status: DC

## 2023-09-30 ENCOUNTER — Other Ambulatory Visit (INDEPENDENT_AMBULATORY_CARE_PROVIDER_SITE_OTHER): Payer: Self-pay | Admitting: Emergency Medicine

## 2023-09-30 DIAGNOSIS — N952 Postmenopausal atrophic vaginitis: Secondary | ICD-10-CM

## 2023-09-30 MED ORDER — ESTRADIOL 10 MCG VAGINAL TABLET
ORAL_TABLET | VAGINAL | 1 refills | Status: AC
Start: 2023-09-30 — End: ?

## 2023-09-30 NOTE — Telephone Encounter (Signed)
 Pt would like for you to start filling this for her. Mykai Wendorf, [place]  09/30/2023 09:59

## 2023-10-28 ENCOUNTER — Ambulatory Visit (INDEPENDENT_AMBULATORY_CARE_PROVIDER_SITE_OTHER): Payer: Self-pay | Admitting: Emergency Medicine

## 2023-10-28 ENCOUNTER — Other Ambulatory Visit: Payer: Self-pay

## 2023-10-28 ENCOUNTER — Encounter (INDEPENDENT_AMBULATORY_CARE_PROVIDER_SITE_OTHER): Payer: Self-pay | Admitting: Emergency Medicine

## 2023-10-28 VITALS — BP 110/80 | HR 64 | Resp 16 | Ht 64.0 in | Wt 215.6 lb

## 2023-10-28 DIAGNOSIS — E785 Hyperlipidemia, unspecified: Secondary | ICD-10-CM

## 2023-10-28 DIAGNOSIS — Z1231 Encounter for screening mammogram for malignant neoplasm of breast: Secondary | ICD-10-CM

## 2023-10-28 DIAGNOSIS — N952 Postmenopausal atrophic vaginitis: Secondary | ICD-10-CM

## 2023-10-28 DIAGNOSIS — K760 Fatty (change of) liver, not elsewhere classified: Secondary | ICD-10-CM

## 2023-10-28 DIAGNOSIS — K7689 Other specified diseases of liver: Secondary | ICD-10-CM

## 2023-10-28 DIAGNOSIS — E669 Obesity, unspecified: Secondary | ICD-10-CM

## 2023-10-28 NOTE — Progress Notes (Signed)
 [facility]  [address]  [place] [address]  Dept Phone: [phone]  Dept Fax: [phone]    Erica Harper  05-Oct-1960  [id number]    Date of Service: 10/28/2023     Chief complaint:   Chief Complaint   Patient presents with    Follow Up         Subjective:     She comes in today for Follow Up.    Vaginal atrophy: has been well controlled with vagifem .  Doing well without side effects.  Uses it twice per week.    Hyperlipidemia has been doing well on her simvastatin .  Has no complaints today.      PHQ Questionnaire  Little interest or pleasure in doing things.: Not at all  Feeling down, depressed, or hopeless: Not at all  PHQ 2 Total: 0          Review of Systems   Constitutional:  Negative for chills and fever.   Eyes:  Negative for visual disturbance.   Respiratory:  Negative for chest tightness and shortness of breath.    Cardiovascular:  Negative for chest pain and palpitations.   Gastrointestinal:  Negative for abdominal pain.         Current Outpatient Medications   Medication Sig    ergocalciferol , vitamin D2, (DRISDOL ) 1,250 mcg (50,000 unit) Oral Capsule Take 1 Capsule (50,000 Units total) by mouth Once a day    estradiol  (VAGIFEM ) vaginal tablet twice a week as needed.    simvastatin  (ZOCOR ) 20 mg Oral Tablet Take 1 Tablet (20 mg total) by mouth Every evening She can only have a 30 day supply until labs    tirzepatide , weight loss, (ZEPBOUND ) 2.5 mg/0.5 mL Subcutaneous Pen Injector Inject 0.5 mL (2.5 mg total) under the skin Every 7 days (Patient not taking: Reported on 10/28/2023)         Objective:   BP 110/80   Pulse 64   Resp 16   Ht 1.626 m (5' 4)   Wt 97.8 kg (215 lb 9.6 oz)   LMP 03/19/2015   SpO2 98%   BMI 37.01 kg/m         Physical Exam  Constitutional:       General: She is not in acute distress.     Appearance: Normal appearance. She is not ill-appearing.   HENT:      Head: Normocephalic.      Right Ear: Tympanic membrane and ear canal normal.       Left Ear: Tympanic membrane and ear canal normal.      Mouth/Throat:      Mouth: Mucous membranes are moist.   Eyes:      General: No scleral icterus.  Cardiovascular:      Rate and Rhythm: Normal rate and regular rhythm.      Heart sounds: Normal heart sounds. No murmur heard.  Pulmonary:      Effort: Pulmonary effort is normal. No respiratory distress.      Breath sounds: Normal breath sounds. No wheezing, rhonchi or rales.   Abdominal:      General: Abdomen is flat. Bowel sounds are normal.      Palpations: Abdomen is soft. There is no mass.      Tenderness: There is no abdominal tenderness. There is no guarding or rebound.   Musculoskeletal:      Cervical back: Neck supple.      Right lower leg: No edema.  Left lower leg: No edema.   Skin:     General: Skin is warm.   Neurological:      General: No focal deficit present.      Mental Status: She is alert.   Psychiatric:         Mood and Affect: Mood normal.          Health Maintenance     Health Maintenance Due   Topic Date Due    HIV Screening  Never done    Shingles Vaccine (1 of 2) Never done    Pneumococcal Vaccination, Age 41+ (1 of 1 - PCV) Never done    Breast Cancer Screening  09/23/2023    NonMedicare Preventative Exam  10/09/2023    Influenza Vaccine (1) 10/19/2023       Assessment and Plan     Vyolet was seen today for follow up.    Diagnoses and all orders for this visit:    Atrophic vaginitis        - managed on Vagifem .  Has had resolution of symptoms since use.  She will call when she needs a refill.    NAFLD (nonalcoholic fatty liver disease)         - fatty liver disease on prior imaging.  We will continue to monitor liver function.    Hyperlipidemia LDL goal <100         - doing well on simvastatin .  Continue    Obesity (BMI 35.0-39.9 without comorbidity)         - continue to watch diet and exercise    Breast cancer screening by mammogram  -     MAMMO BILATERAL SCREENING-ADDL VIEWS/BREAST US  AS REQ BY RAD; Future    Hepatic cyst         - reviewed GI last note.  They recommended to closely monitor LFTs.  Last time she saw them was in 2023.  They also recommended a follow up MRI in 2024.  I will discuss this with the patient when she gets her labs drawn in October.     Patient to have labs done at the lab fair.    Follow up     Depression screening is negative. PHQ 2 Total: 0     Follow up:  Return in about 6 months (around 04/26/2024) for High cholesterol.    Tyshawn Keel Teubert, DO

## 2023-10-31 ENCOUNTER — Other Ambulatory Visit (HOSPITAL_COMMUNITY): Payer: Self-pay

## 2023-10-31 DIAGNOSIS — IMO0001 Reserved for inherently not codable concepts without codable children: Secondary | ICD-10-CM

## 2023-10-31 LAB — CBC WITH DIFF
BASOPHIL #: <0.1 x10ˆ3/uL (ref <=0.20)
BASOPHIL %: 1 %
EOSINOPHIL #: 0.24 x10ˆ3/uL (ref <=0.50)
EOSINOPHIL %: 4.2 %
HCT: 41.9 % (ref 34.8–46.0)
HGB: 13.6 g/dL (ref 11.5–16.0)
IMMATURE GRANULOCYTE #: <0.1 x10ˆ3/uL (ref <0.10)
IMMATURE GRANULOCYTE %: 0.2 % (ref 0.0–1.0)
LYMPHOCYTE #: 1.76 x10ˆ3/uL (ref 1.00–4.80)
LYMPHOCYTE %: 30.4 %
MCH: 28.7 pg (ref 26.0–32.0)
MCHC: 32.5 g/dL (ref 31.0–35.5)
MCV: 88.4 fL (ref 78.0–100.0)
MONOCYTE #: 0.47 x10ˆ3/uL (ref 0.20–1.10)
MONOCYTE %: 8.1 %
MPV: 10.6 fL (ref 8.7–12.5)
NEUTROPHIL #: 3.24 x10ˆ3/uL (ref 1.50–7.70)
NEUTROPHIL %: 56.1 %
PLATELETS: 274 x10ˆ3/uL (ref 150–400)
RBC: 4.74 x10ˆ6/uL (ref 3.85–5.22)
RDW-CV: 14.5 % (ref 11.5–15.5)
WBC: 5.8 x10ˆ3/uL (ref 3.7–11.0)

## 2023-10-31 LAB — COMPREHENSIVE METABOLIC PANEL, NON-FASTING
ALBUMIN: 3.7 g/dL (ref 3.4–4.8)
ALKALINE PHOSPHATASE: 87 U/L (ref 50–130)
ALT (SGPT): 20 U/L (ref 8–22)
ANION GAP: 8 mmol/L (ref 4–13)
AST (SGOT): 17 U/L (ref 8–45)
BILIRUBIN TOTAL: 0.5 mg/dL (ref 0.3–1.3)
BUN/CREA RATIO: 11 (ref 6–22)
BUN: 10 mg/dL (ref 8–25)
CALCIUM: 9.4 mg/dL (ref 8.6–10.3)
CHLORIDE: 109 mmol/L (ref 96–111)
CO2 TOTAL: 25 mmol/L (ref 23–31)
CREATININE: 0.89 mg/dL (ref 0.60–1.05)
GLUCOSE: 88 mg/dL (ref 65–125)
POTASSIUM: 4.2 mmol/L (ref 3.5–5.1)
PROTEIN TOTAL: 7.7 g/dL (ref 6.0–8.0)
SODIUM: 142 mmol/L (ref 136–145)
eGFRcr - FEMALE: 73 mL/min/1.73mˆ2 (ref >=60)

## 2023-10-31 LAB — LIPID PANEL
CHOL/HDL RATIO: 2.5
CHOLESTEROL: 189 mg/dL (ref 100–200)
HDL CHOL: 77 mg/dL (ref >=50)
LDL CALC: 101 mg/dL — ABNORMAL HIGH (ref <100)
NON-HDL: 112 mg/dL (ref <=190)
TRIGLYCERIDES: 57 mg/dL (ref <150)
VLDL CALC: 9 mg/dL (ref <30)

## 2023-10-31 LAB — MAGNESIUM: MAGNESIUM: 2.1 mg/dL (ref 1.8–2.6)

## 2023-10-31 LAB — PHOSPHORUS: PHOSPHORUS: 3.2 mg/dL (ref 2.3–4.0)

## 2023-10-31 LAB — URIC ACID: URIC ACID: 4.9 mg/dL (ref 2.9–6.3)

## 2023-11-03 ENCOUNTER — Ambulatory Visit
Admission: RE | Admit: 2023-11-03 | Discharge: 2023-11-03 | Disposition: A | Discharge status: Home / Self Care | Payer: Self-pay | Source: Ambulatory Visit | Attending: Emergency Medicine | Admitting: Emergency Medicine

## 2023-11-03 ENCOUNTER — Encounter (HOSPITAL_COMMUNITY): Payer: Self-pay | Admitting: Mammography

## 2023-11-03 ENCOUNTER — Other Ambulatory Visit: Payer: Self-pay

## 2023-11-03 DIAGNOSIS — Z1231 Encounter for screening mammogram for malignant neoplasm of breast: Secondary | ICD-10-CM | POA: Insufficient documentation

## 2023-11-04 ENCOUNTER — Ambulatory Visit (INDEPENDENT_AMBULATORY_CARE_PROVIDER_SITE_OTHER): Payer: Self-pay | Admitting: Emergency Medicine

## 2023-11-04 DIAGNOSIS — Z1231 Encounter for screening mammogram for malignant neoplasm of breast: Secondary | ICD-10-CM

## 2023-11-05 LAB — VITAMIN D 25 TOTAL: VITAMIN D 25, TOTAL: 46.4 ng/mL (ref 20.0–100.0)

## 2023-11-05 LAB — THYROID STIMULATING HORMONE (SENSITIVE TSH): TSH: 2.401 u[IU]/mL (ref 0.350–4.940)

## 2023-12-14 ENCOUNTER — Encounter (HOSPITAL_COMMUNITY): Payer: Self-pay

## 2024-01-27 ENCOUNTER — Other Ambulatory Visit (INDEPENDENT_AMBULATORY_CARE_PROVIDER_SITE_OTHER): Payer: Self-pay | Admitting: Emergency Medicine

## 2024-04-26 ENCOUNTER — Encounter (INDEPENDENT_AMBULATORY_CARE_PROVIDER_SITE_OTHER): Payer: Self-pay | Admitting: Emergency Medicine
